# Patient Record
Sex: Female | Born: 1955 | Race: Black or African American | Hispanic: No | Marital: Married | Smoking: Never smoker
Health system: Southern US, Community
[De-identification: ages and names within clinical notes are randomized; demographics above are authoritative.]

## PROBLEM LIST (undated history)

## (undated) DIAGNOSIS — I10 Essential (primary) hypertension: Secondary | ICD-10-CM

## (undated) DIAGNOSIS — M1990 Unspecified osteoarthritis, unspecified site: Secondary | ICD-10-CM

## (undated) HISTORY — PX: ABDOMINAL HYSTERECTOMY: SHX81

---

## 2005-05-09 ENCOUNTER — Ambulatory Visit: Payer: Self-pay | Admitting: Internal Medicine

## 2005-05-16 ENCOUNTER — Ambulatory Visit: Payer: Self-pay | Admitting: Internal Medicine

## 2007-03-30 ENCOUNTER — Ambulatory Visit: Payer: Self-pay

## 2007-04-21 IMAGING — CT CT ABD-PELV W/ CM
1 of 2 series · 16 of 32 positions shown, 20 images · non-contrast
Comparison: none

REASON FOR EXAM: periumbilical pain radiating to RLQ and LLQ hematuria
COMMENTS:

PROCEDURE:     CT  - CT ABDOMEN / PELVIS  W  - May 09, 2005  [DATE]
RESULT:
HISTORY: Periumbilical pain.

[Series 2: abdomen · axial · 0.63mm/px · z∈[+556,+948]mm · 16 of 55 slices shown, 20 images]
[im 3/55  soft-tissue]
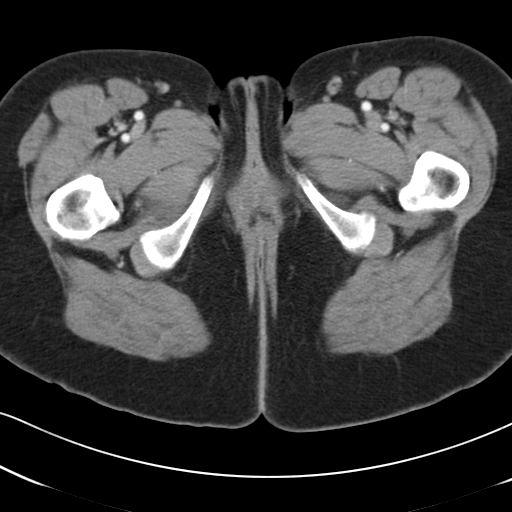
[im 3/55  bone]
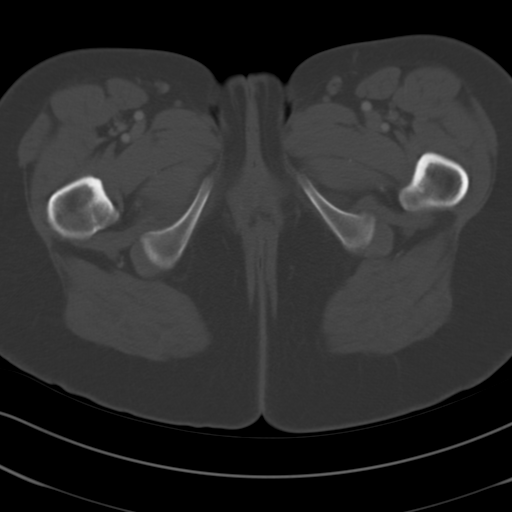
[im 7/55  soft-tissue]
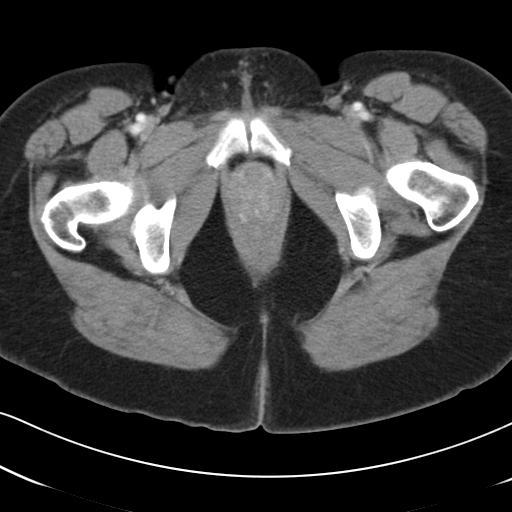
[im 11/55  soft-tissue]
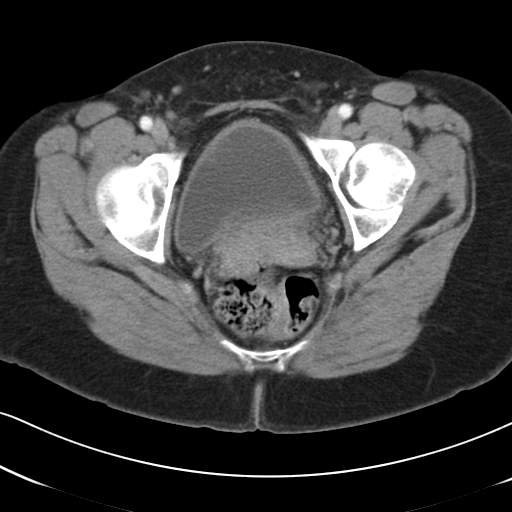
[im 16/55  soft-tissue]
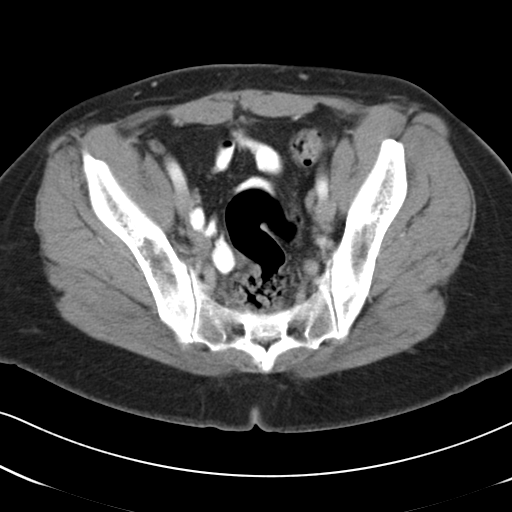
[im 18/55  soft-tissue]
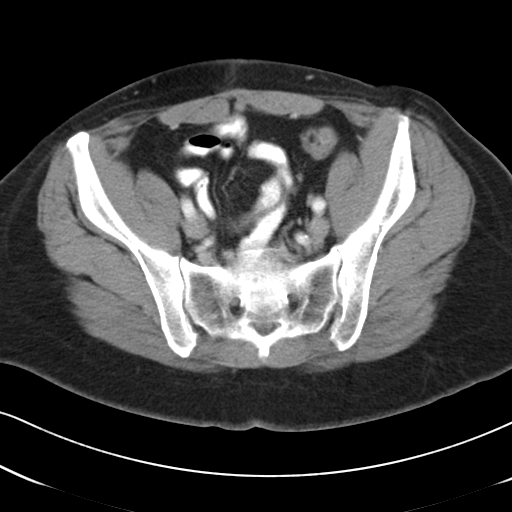
[im 22/55  soft-tissue]
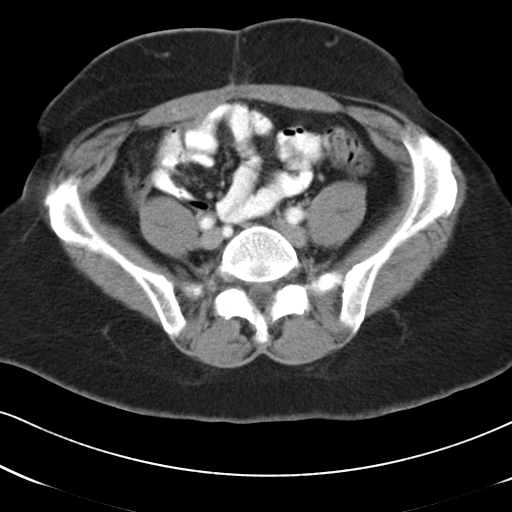
[im 26/55  soft-tissue]
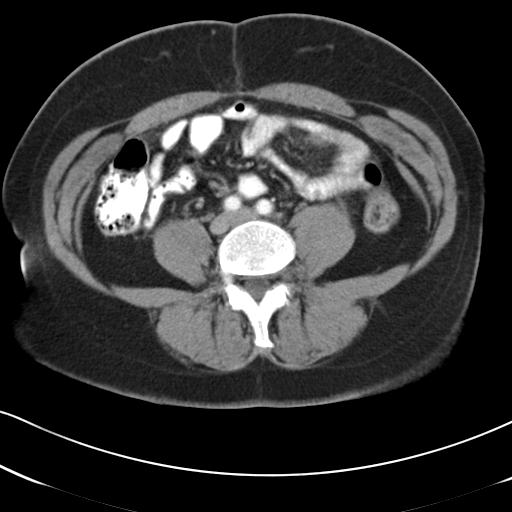
[im 29/55  soft-tissue]
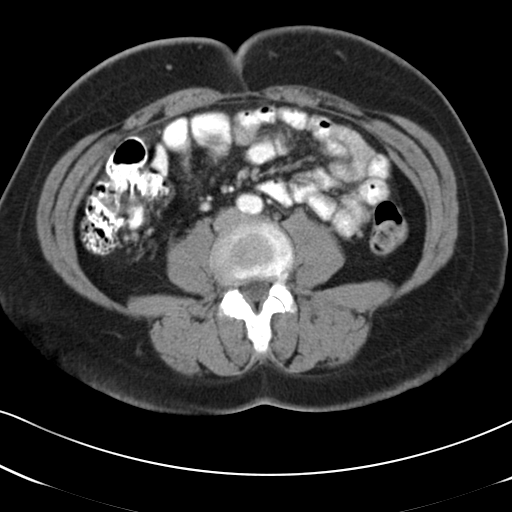
[im 33/55  soft-tissue]
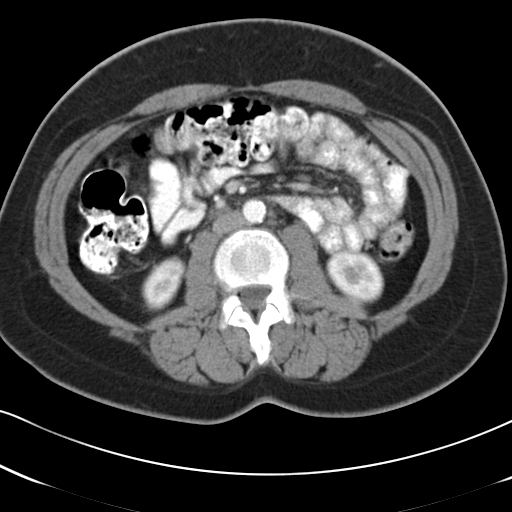
[im 33/55  bone]
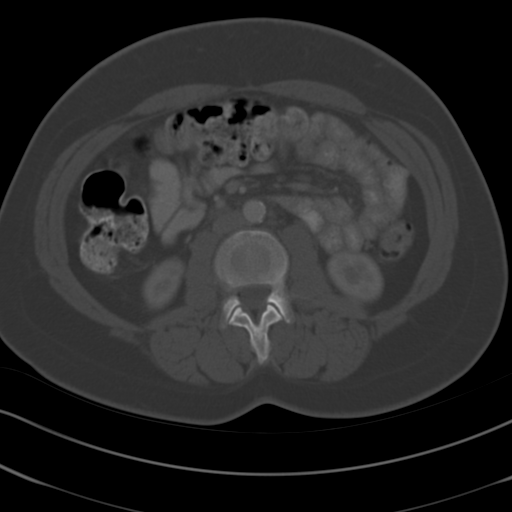
[im 37/55  soft-tissue]
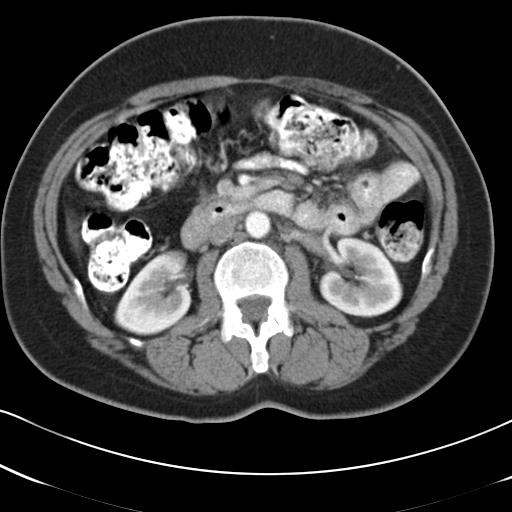
[im 42/55  soft-tissue]
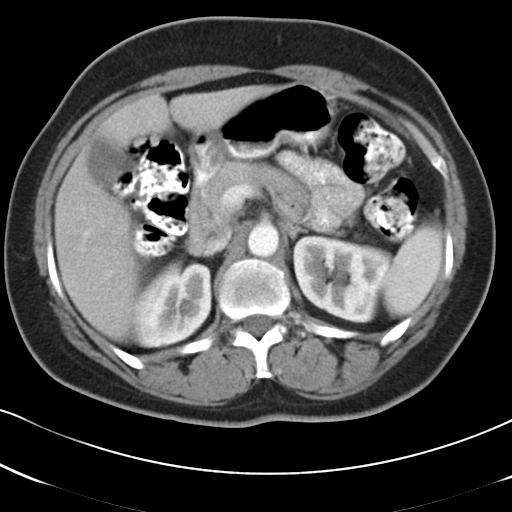
[im 44/55  soft-tissue]
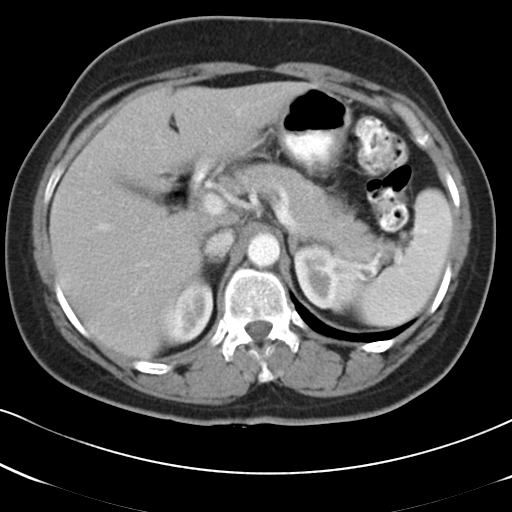
[im 46/55  lung]
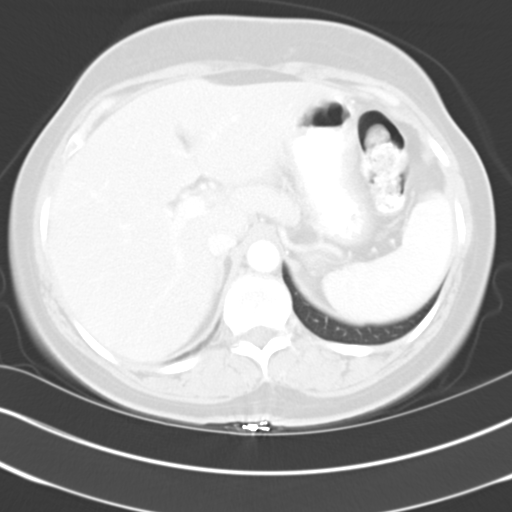
[im 48/55  soft-tissue]
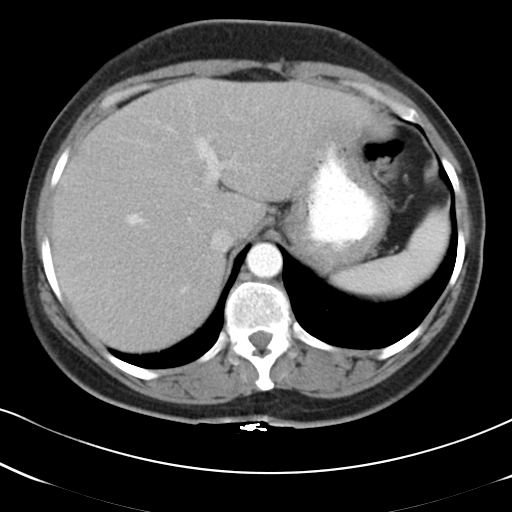
[im 48/55  lung]
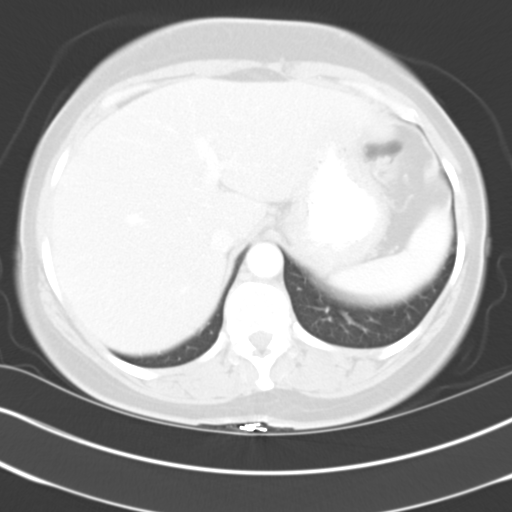
[im 50/55  lung]
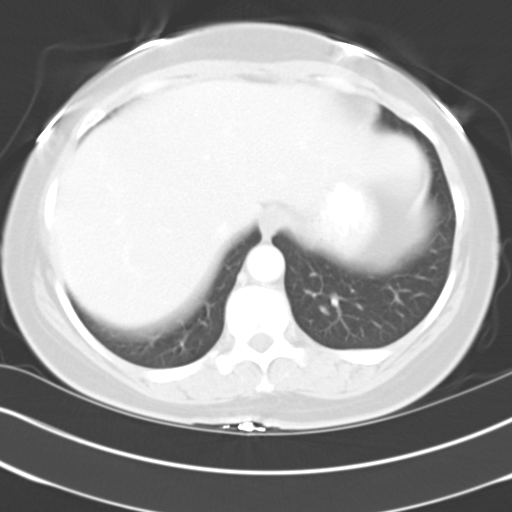
[im 52/55  soft-tissue]
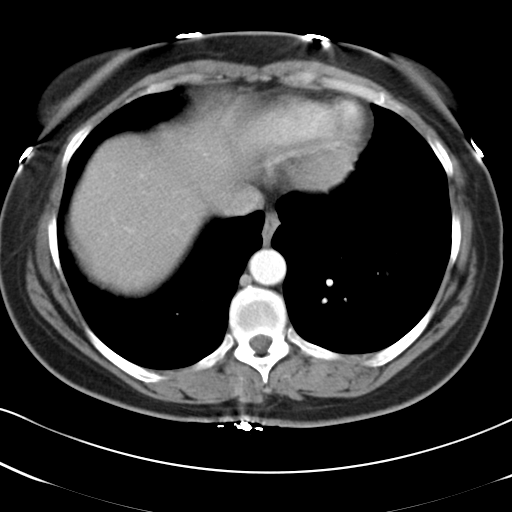
[im 52/55  lung]
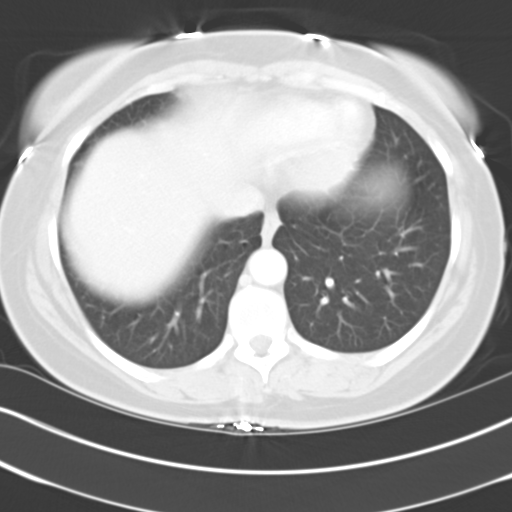

[16 of 32 positions shown; findings below may reference images not displayed]

COMPARISON STUDIES: None.

PROCEDURE AND FINDINGS: The liver and spleen are normal. The pancreas is
normal.  The gallbladder is non-distended.  The adrenals are normal. No
focal renal abnormalities are noted. There is no bowel distention. Bilateral
adnexal fullness is noted.  Pelvic ultrasound can be obtained if clinically
indicated.
IMPRESSION: 1)Mild bilateral adenexal fullness..

## 2008-07-13 ENCOUNTER — Ambulatory Visit: Payer: Self-pay | Admitting: Obstetrics and Gynecology

## 2009-09-05 ENCOUNTER — Ambulatory Visit: Payer: Self-pay | Admitting: Obstetrics and Gynecology

## 2011-03-18 ENCOUNTER — Ambulatory Visit: Payer: Self-pay | Admitting: Obstetrics and Gynecology

## 2012-03-25 ENCOUNTER — Ambulatory Visit: Payer: Self-pay | Admitting: Obstetrics and Gynecology

## 2013-02-15 LAB — CBC
HCT: 41.9 % (ref 35.0–47.0)
HGB: 14.4 g/dL (ref 12.0–16.0)
MCHC: 34.3 g/dL (ref 32.0–36.0)
MCV: 93 fL (ref 80–100)
Platelet: 227 10*3/uL (ref 150–440)
RDW: 12.7 % (ref 11.5–14.5)

## 2013-02-15 LAB — ETHANOL
Ethanol %: <0.003 % (ref 0.000–0.080)
Ethanol: <3 mg/dL

## 2013-02-15 LAB — COMPREHENSIVE METABOLIC PANEL
Albumin: 4.1 g/dL (ref 3.4–5.0)
Alkaline Phosphatase: 99 U/L (ref 50–136)
Anion Gap: 4 — ABNORMAL LOW (ref 7–16)
BUN: 15 mg/dL (ref 7–18)
Bilirubin,Total: 0.4 mg/dL (ref 0.2–1.0)
Co2: 30 mmol/L (ref 21–32)
Creatinine: 0.77 mg/dL (ref 0.60–1.30)
Glucose: 126 mg/dL — ABNORMAL HIGH (ref 65–99)
Osmolality: 282 (ref 275–301)
Potassium: 3.5 mmol/L (ref 3.5–5.1)
SGOT(AST): 22 U/L (ref 15–37)

## 2013-02-15 LAB — URINALYSIS, COMPLETE
Ketone: NEGATIVE
Leukocyte Esterase: NEGATIVE
Ph: 8 (ref 4.5–8.0)
WBC UR: 4 /HPF (ref 0–5)

## 2013-02-15 LAB — LIPASE, BLOOD: Lipase: 83 U/L (ref 73–393)

## 2013-02-16 ENCOUNTER — Inpatient Hospital Stay: Payer: Self-pay | Admitting: Surgery

## 2013-03-29 ENCOUNTER — Ambulatory Visit: Payer: Self-pay | Admitting: Obstetrics and Gynecology

## 2013-05-19 ENCOUNTER — Ambulatory Visit: Payer: Self-pay | Admitting: Unknown Physician Specialty

## 2014-04-25 ENCOUNTER — Ambulatory Visit: Payer: Self-pay | Admitting: Obstetrics and Gynecology

## 2014-07-28 NOTE — H&P (Signed)
Subjective/Chief Complaint N/V/abd pain   History of Present Illness acute onset N/V and abd pain approx 1630 with muklt emesis. Pain is improved now. Vomited contrast after CT. NG placed. No prior episode. No melena or hematochezia, no flatus since onset of symptoms.   Past History PMH none PSH hysterect, ovaries in place   Past Med/Surgical Hx:  Hysterectomy - Partial:   ALLERGIES:  No Known Allergies:   Family and Social History:  Family History Non-Contributory   Social History negative tobacco, negative ETOH   Place of Living Home   Review of Systems:  Fever/Chills No   Cough No   Abdominal Pain Yes  improved   Diarrhea No   Constipation No   Nausea/Vomiting Yes   SOB/DOE No   Chest Pain No   Dysuria No   Tolerating Diet Nauseated  Vomiting   Physical Exam:  GEN no acute distress, gagging from ng placement   HEENT pink conjunctivae   NECK supple   RESP normal resp effort  clear BS   CARD regular rate   ABD denies tenderness  soft  infraumb scar, min distended, nontender   LYMPH negative neck   EXTR positive cyanosis/clubbing   SKIN normal to palpation   PSYCH alert, A+O to time, place, person, good insight   Lab Results: Hepatic:  11-Nov-14 21:10   Bilirubin, Total 0.4  Alkaline Phosphatase 99  SGPT (ALT) 26  SGOT (AST) 22  Total Protein, Serum 7.8  Albumin, Serum 4.1  Routine Chem:  11-Nov-14 21:10   Glucose, Serum  126  BUN 15  Creatinine (comp) 0.77  Sodium, Serum 140  Potassium, Serum 3.5  Chloride, Serum 106  CO2, Serum 30  Calcium (Total), Serum 9.4  Osmolality (calc) 282  eGFR (African American) >60  eGFR (Non-African American) >60 (eGFR values <71mL/min/1.73 m2 may be an indication of chronic kidney disease (CKD). Calculated eGFR is useful in patients with stable renal function. The eGFR calculation will not be reliable in acutely ill patients when serum creatinine is changing rapidly. It is not useful in   patients on dialysis. The eGFR calculation may not be applicable to patients at the low and high extremes of body sizes, pregnant women, and vegetarians.)  Anion Gap  4  Lipase 83 (Result(s) reported on 15 Feb 2013 at 09:41PM.)    21:18   Ethanol, S. < 3  Ethanol % (comp) < 0.003 (Result(s) reported on 15 Feb 2013 at 11:49PM.)  Routine UA:  11-Nov-14 21:10   Color (UA) Yellow  Clarity (UA) Cloudy  Glucose (UA) Negative  Bilirubin (UA) Negative  Ketones (UA) Negative  Specific Gravity (UA) 1.010  Blood (UA) 1+  pH (UA) 8.0  Protein (UA) Negative  Nitrite (UA) Negative  Leukocyte Esterase (UA) Negative (Result(s) reported on 15 Feb 2013 at 11:35PM.)  RBC (UA) 4 /HPF  WBC (UA) 4 /HPF  Bacteria (UA) 2+  Epithelial Cells (UA) 1 /HPF  Mucous (UA) PRESENT (Result(s) reported on 15 Feb 2013 at 11:35PM.)  Routine Hem:  11-Nov-14 21:10   WBC (CBC)  21.7  RBC (CBC) 4.52  Hemoglobin (CBC) 14.4  Hematocrit (CBC) 41.9  Platelet Count (CBC) 227 (Result(s) reported on 15 Feb 2013 at 09:34PM.)  MCV 93  MCH 31.8  MCHC 34.3  RDW 12.7   Radiology Results: CT:    12-Nov-14 01:31, CT Abdomen and Pelvis With Contrast  CT Abdomen and Pelvis With Contrast  REASON FOR EXAM:    (1) diffuse n/v diffuse abdominal  tenerness,   leukocytosis,; (2) diffuse n/v diff  COMMENTS:       PROCEDURE: CT  - CT ABDOMEN / PELVIS  W  - Feb 16 2013  1:31AM     CLINICAL DATA:  Nausea and vomiting. Diffuse abdominal tenderness.  Leukocytosis.    EXAM:  CT ABDOMEN AND PELVIS WITH CONTRAST    TECHNIQUE:  Multidetector CT imaging of the abdomen and pelvis was performed  using the standard protocol following bolus administration of  intravenous contrast.    CONTRAST:  125 ccIsovue 370    COMPARISON:  05/09/2005.    FINDINGS:  Interval small amount of free peritoneal fluid. The stomach is  dilated and filled with ingested oral contrast. The proximal  duodenum is mildly dilated and filled with oral  contrast. The distal  duodenum and proximal jejunum are normal in caliber. There are  multiple moderately dilated mid and distal jejunal loops with normal  caliber distal ileum. No bowel wall thickening or pneumatosis is  seen.  Unremarkable liver, spleen, pancreas, gallbladder, adrenal glands  and urinary bladder. Small bilateral renal cysts. No enlarged lymph  nodes. Normal appearing appendix. Minimal dependent atelectasis at  both lung bases. Mild lumbar and lower thoracic spine degenerative  changes and scoliosis.     IMPRESSION:  1. Findings described above most compatible with a closed loop mid  small bowel obstruction.  2. Small amount of free peritoneal fluid.  3. Dilated stomach, filled with the ingested oral contrast.      Electronically Signed    By: Enrique Sack M.D.    On: 02/16/2013 01:47         Verified By: Gerald Stabs, M.D.,    Assessment/Admission Diagnosis SBO admit, hydrate, reexamine and repeat KUB. If no improvement may need exploration today.   Electronic Signatures: Florene Glen (MD)  (Signed [phone] 03:01)  Authored: CHIEF COMPLAINT and HISTORY, PAST MEDICAL/SURGIAL HISTORY, ALLERGIES, FAMILY AND SOCIAL HISTORY, REVIEW OF SYSTEMS, PHYSICAL EXAM, LABS, Radiology, ASSESSMENT AND PLAN   Last Updated: 12-Nov-14 03:01 by Florene Glen (MD)

## 2014-07-28 NOTE — H&P (Signed)
PATIENT NAME:  Julia Callahan, KOCHAN MR#:  [id number] DATE OF BIRTH:  1955/07/29  DATE OF ADMISSION:  02/15/2013  CHIEF COMPLAINT: Nausea, vomiting and abdominal pain.   HISTORY OF PRESENT ILLNESS: This is a 59 year old female patient with diffuse abdominal pain that started at approximately 4:00 this afternoon. She has never had an episode like this before. About an hour after the abdominal pain started, she started vomiting, vomited 5 times at home and has vomited her contrast after her CT scan tonight. She is nauseated. She has not passed gas this evening, had a normal bowel movement prior to the onset of symptoms. She denies melena, hematochezia. No hematemesis. A nasogastric tube has just been placed and she is gagging from that. She denies fevers or chills, and states that her pain is better than it was when it started.   PAST MEDICAL HISTORY: None.   PAST SURGICAL HISTORY: Hysterectomy. Ovaries are still in place.   MEDICATIONS: None.   ALLERGIES: NONE.   FAMILY HISTORY: Noncontributory.   SOCIAL HISTORY: The patient does not smoke or drink.   REVIEW OF SYSTEMS: A 10-system review was performed and negative with the exception of that mentioned in the history of present illness.   PHYSICAL EXAMINATION: GENERAL: A healthy female patient. She is gagging from her recently nasogastric tube and spitting up clear, nonbilious fluid. She is actually covered in contrast media that she has just vomited.  VITAL SIGNS: Temperature of 98.4, pulse 86, respirations 18, pain scale of 4, blood pressure 169/82, 98% room air sat.  HEENT: Shows a nasogastric tube in place. No scleral icterus.  NECK: No palpable neck nodes.  CHEST: Clear to auscultation.  CARDIAC: Regular rate and rhythm.  ABDOMEN: Shows a low infraumbilical midline scar without hernia. The abdomen is only minimally distended, minimally tympanitic, and essentially nontender. No peritoneal signs.  EXTREMITIES: Without edema. Calves are  nontender.  NEUROLOGIC: Grossly intact.  INTEGUMENT: No jaundice.   LABORATORY VALUES: Demonstrate a serum glucose of 126. Otherwise, electrolytes are within normal limits. White blood cell count is 21.7, hemoglobin and hematocrit of 14 and 42, and a platelet count 227,000.   Urinalysis shows 1+ blood but 2+ bacteria, with specific gravity of 1.010.   CT scan is personally reviewed, which demonstrates gas in the colon, dilated stomach full of contrast, dilated loops of bowel. It is being read as possible a closed loop obstruction.   ASSESSMENT AND PLAN: This is a patient with the acute onset of abdominal pain, which has improved, nausea, vomiting and a history of a hysterectomy. 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 Her CT scan has been personally reviewed. Her exam is consistent with a small bowel obstruction, but she is essentially nontender at this point. A nasogastric tube has just been placed for a very dilated stomach, and in the process of placing the nasogastric tube, she apparently vomited all of the contrast material seen on CT scan in a dilated stomach. The plan would be to admit her to the hospital and hydrate her, with serial exams, repeating her KUB and comparing it to the findings of the CT scan. I discussed with her and her daughter the rationale for this approach and that, if she does not improve rapidly, because of the findings on CT scan, she may require emergent surgery in the form of exploratory laparotomy and small bowel reduction. The rationale for this has been discussed, and they are in agreement with this plan. I will place orders for  hydration and repeating films.   ____________________________ Adah Salvageichard E. Excell Seltzerooper, MD rec:cg D: 02/16/2013 02:45:04 ET T: 02/16/2013 03:06:21 ET JOB#: [id number]  cc: Adah Salvageichard E. Excell Seltzerooper, MD, <Dictator> Lattie HawICHARD E COOPER MD ELECTRONICALLY SIGNED 02/16/2013 6:44

## 2014-07-28 NOTE — Discharge Summary (Signed)
PATIENT NAME:  Julia Callahan, Julia Callahan MR#:  [id number] DATE OF BIRTH:  January 07, 1956  DATE OF ADMISSION:  02/16/2013 DATE OF DISCHARGE:  02/17/2013  FINAL DIAGNOSIS: Abdominal pain, partial small bowel obstruction.   PROCEDURES:  1.  CT scan of the abdomen and pelvis. 2.  Nasogastric tube. 3.  Serial abdominal examinations.  HOSPITAL COURSE SUMMARY: The patient was admitted with what looked like a closed bowel obstruction, however, had prompt resolution of her symptoms, passage of flatus and resolution completely of her abdominal pain, while even she was still in the Emergency Room. Repeat examination by me later that same morning demonstrated an absolutely benign abdomen. Her nasogastric tube was able to be discontinued. Her diet was advanced from clear liquids to regular diet. She was able to tolerate this without any difficulty. On hospital day number 1 the patient was completely pain free, eating a regular diet with a soft abdomen and was deemed suitable for discharge. She can keep her p.r.n. follow up with us in the office.   ____________________________ Redge GainerMark A. Egbert GaribaldiBird, MD mab:sb D: 02/17/2013 10:06:24 ET T: 02/17/2013 10:18:54 ET JOB#: [id number]  cc: Loraine LericheMark A. Egbert GaribaldiBird, MD, <Dictator> Raynald KempMARK A Tsutomu Barfoot MD ELECTRONICALLY SIGNED 02/20/2013 16:37

## 2014-09-05 ENCOUNTER — Emergency Department: Payer: BC Managed Care – PPO

## 2014-09-05 ENCOUNTER — Encounter: Payer: Self-pay | Admitting: Emergency Medicine

## 2014-09-05 ENCOUNTER — Other Ambulatory Visit: Payer: Self-pay

## 2014-09-05 ENCOUNTER — Emergency Department
Admission: EM | Admit: 2014-09-05 | Discharge: 2014-09-05 | Disposition: A | Discharge status: Home / Self Care | Payer: BC Managed Care – PPO | Attending: Emergency Medicine | Admitting: Emergency Medicine

## 2014-09-05 DIAGNOSIS — Z79899 Other long term (current) drug therapy: Secondary | ICD-10-CM | POA: Diagnosis not present

## 2014-09-05 DIAGNOSIS — F419 Anxiety disorder, unspecified: Secondary | ICD-10-CM | POA: Diagnosis not present

## 2014-09-05 DIAGNOSIS — R0789 Other chest pain: Secondary | ICD-10-CM | POA: Diagnosis not present

## 2014-09-05 DIAGNOSIS — I10 Essential (primary) hypertension: Secondary | ICD-10-CM | POA: Diagnosis not present

## 2014-09-05 DIAGNOSIS — R079 Chest pain, unspecified: Secondary | ICD-10-CM | POA: Diagnosis present

## 2014-09-05 HISTORY — DX: Essential (primary) hypertension: I10

## 2014-09-05 LAB — CBC
HEMATOCRIT: 40.2 % (ref 35.0–47.0)
Hemoglobin: 13.6 g/dL (ref 12.0–16.0)
MCH: 31.5 pg (ref 26.0–34.0)
MCHC: 33.9 g/dL (ref 32.0–36.0)
MCV: 92.8 fL (ref 80.0–100.0)
PLATELETS: 220 10*3/uL (ref 150–440)
RBC: 4.33 MIL/uL (ref 3.80–5.20)
RDW: 13.2 % (ref 11.5–14.5)
WBC: 7.8 10*3/uL (ref 3.6–11.0)

## 2014-09-05 LAB — BASIC METABOLIC PANEL
Anion gap: 7 (ref 5–15)
BUN: 10 mg/dL (ref 6–20)
CO2: 29 mmol/L (ref 22–32)
Calcium: 9.1 mg/dL (ref 8.9–10.3)
Chloride: 106 mmol/L (ref 101–111)
Creatinine, Ser: 0.65 mg/dL (ref 0.44–1.00)
GFR calc Af Amer: >60 mL/min (ref >60)
Glucose, Bld: 103 mg/dL — ABNORMAL HIGH (ref 65–99)
POTASSIUM: 3.9 mmol/L (ref 3.5–5.1)
Sodium: 142 mmol/L (ref 135–145)

## 2014-09-05 LAB — TROPONIN I: Troponin I: <0.03 ng/mL (ref <0.031)

## 2014-09-05 LAB — BRAIN NATRIURETIC PEPTIDE: B NATRIURETIC PEPTIDE 5: 26 pg/mL (ref 0.0–100.0)

## 2014-09-05 MED ORDER — NAPROXEN 500 MG PO TABS: 500.0000 mg | ORAL_TABLET | Freq: Two times a day (BID) | ORAL | Status: DC

## 2014-09-05 NOTE — ED Notes (Signed)
Pt presents with chest pain and shortness of breath for three weeks, pain is sharp in her right side chest and is constant.

## 2014-09-05 NOTE — ED Provider Notes (Signed)
[facility] Emergency Department Provider Note  ____________________________________________  Time seen: On arrival  I have reviewed the triage vital signs and the nursing notes.   HISTORY  Chief Complaint Chest Pain     HPI Julia Callahan is a 59 y.o. female who presents with right-sided lower chest discomfort for 3 weeks. The pain has been constant and mild in nature, she discussed it is achy. No shortness of breath. No radiation. No injury to the area.     Past Medical History  Diagnosis Date  . Hypertension     There are no active problems to display for this patient.   History reviewed. No pertinent past surgical history.  Current Outpatient Rx  Name  Route  Sig  Dispense  Refill  . NIFEdipine (PROCARDIA-XL/ADALAT-CC/NIFEDICAL-XL) 30 MG 24 hr tablet   Oral   Take 30 mg by mouth daily.         . simvastatin (ZOCOR) 20 MG tablet   Oral   Take 20 mg by mouth daily.         . valACYclovir (VALTREX) 500 MG tablet   Oral   Take 500 mg by mouth daily as needed.         . Vitamin D, Cholecalciferol, 1000 UNITS TABS   Oral   Take 1 tablet by mouth daily.           Allergies Sulfa antibiotics  No family history on file.  Social History History  Substance Use Topics  . Smoking status: Never Smoker   . Smokeless tobacco: Not on file  . Alcohol Use: Yes    Review of Systems  Constitutional: Negative for fever. Eyes: Negative for visual changes. ENT: Negative for sore throat Cardiovascular: Positive for chest wall pain Respiratory: Negative for shortness of breath. Gastrointestinal: Negative for abdominal pain, vomiting and diarrhea. Genitourinary: Negative for dysuria. Musculoskeletal: Negative for back pain. Skin: Negative for rash. Neurological: Negative for headaches or focal weakness Psychiatric: Positive anxiety  10-point ROS otherwise negative.  ____________________________________________   PHYSICAL  EXAM:  VITAL SIGNS: ED Triage Vitals  Enc Vitals Group     BP 09/05/14 0950 141/69 mmHg     Pulse Rate 09/05/14 0950 69     Resp 09/05/14 0950 18     Temp 09/05/14 0950 98.2 F (36.8 C)     Temp Source 09/05/14 0950 Oral     SpO2 09/05/14 0950 96 %     Weight 09/05/14 0950 161 lb (73.029 kg)     Height 09/05/14 0950 5\' 6"  (1.676 m)     Head Cir --      Peak Flow --      Pain Score 09/05/14 1000 9     Pain Loc --      Pain Edu? --      Excl. in GC? --      Constitutional: Alert and oriented. Well appearing and in no distress. Eyes: Conjunctivae are normal. PERRL. ENT   Head: Normocephalic and atraumatic.   Nose: No rhinnorhea.   Mouth/Throat: Mucous membranes are moist. Cardiovascular: Normal rate, regular rhythm. Normal and symmetric distal pulses are present in all extremities. No murmurs, rubs, or gallops. Patient with mild chest wall tenderness to palpation right lateral inferior sternum. No rash or erythema. Normal right breast exam performed with Mr. nurse in the room with me Julia Fermo(Tina) Respiratory: Normal respiratory effort without tachypnea nor retractions. Breath sounds are clear and equal bilaterally.  Gastrointestinal: Soft and non-tender in all  quadrants. No distention. There is no CVA tenderness. Genitourinary: deferred Musculoskeletal: Nontender with normal range of motion in all extremities. No lower extremity tenderness nor edema. Neurologic:  Normal speech and language. No gross focal neurologic deficits are appreciated. Skin:  Skin is warm, dry and intact. No rash noted. Psychiatric: Mood and affect are normal. Patient exhibits appropriate insight and judgment.  ____________________________________________    LABS (pertinent positives/negatives)  Labs Reviewed  BASIC METABOLIC PANEL - Abnormal; Notable for the following:    Glucose, Bld 103 (*)    All other components within normal limits  CBC  BRAIN NATRIURETIC PEPTIDE  TROPONIN I     ____________________________________________   EKG  ED ECG REPORT I, Jene Every, the attending physician, personally viewed and interpreted this ECG.  Date: 09/05/2014 EKG Time: 9:50 AM Rate: 70 Rhythm: normal sinus rhythm QRS Axis: normal Intervals: normal ST/T Wave abnormalities: normal Conduction Disutrbances: none Narrative Interpretation: unremarkable   ____________________________________________    RADIOLOGY  Normal chest x-ray  ____________________________________________   PROCEDURES  Procedure(s) performed: none  Critical Care performed: none  ____________________________________________   INITIAL IMPRESSION / ASSESSMENT AND PLAN / ED COURSE  Pertinent labs & imaging results that were available during my care of the patient were reviewed by me and considered in my medical decision making (see chart for details).  Exam and history of present illness most consistent with chest wall discomfort. No tachycardia. Troponin normal. EKG unremarkable. We'll treat with NSAIDs and have patient follow-up with PCP. Strict return precautions given  ____________________________________________   FINAL CLINICAL IMPRESSION(S) / ED DIAGNOSES  Final diagnoses:  Chest wall pain     Jene Every, MD 09/05/14 1544

## 2014-09-05 NOTE — Discharge Instructions (Signed)

## 2015-02-09 ENCOUNTER — Other Ambulatory Visit: Payer: Self-pay | Admitting: Specialist

## 2015-02-09 DIAGNOSIS — M2242 Chondromalacia patellae, left knee: Secondary | ICD-10-CM

## 2015-02-17 ENCOUNTER — Ambulatory Visit (HOSPITAL_BASED_OUTPATIENT_CLINIC_OR_DEPARTMENT_OTHER)
Admission: RE | Admit: 2015-02-17 | Discharge: 2015-02-17 | Disposition: A | Discharge status: Home / Self Care | Payer: BC Managed Care – PPO | Source: Ambulatory Visit | Attending: Specialist | Admitting: Specialist

## 2015-02-17 DIAGNOSIS — M25461 Effusion, right knee: Secondary | ICD-10-CM | POA: Insufficient documentation

## 2015-02-17 DIAGNOSIS — M659 Synovitis and tenosynovitis, unspecified: Secondary | ICD-10-CM | POA: Diagnosis not present

## 2015-02-17 DIAGNOSIS — S83242A Other tear of medial meniscus, current injury, left knee, initial encounter: Secondary | ICD-10-CM | POA: Insufficient documentation

## 2015-02-17 DIAGNOSIS — M2242 Chondromalacia patellae, left knee: Secondary | ICD-10-CM | POA: Diagnosis present

## 2015-02-17 DIAGNOSIS — M7122 Synovial cyst of popliteal space [Baker], left knee: Secondary | ICD-10-CM | POA: Insufficient documentation

## 2015-02-17 DIAGNOSIS — R609 Edema, unspecified: Secondary | ICD-10-CM | POA: Diagnosis not present

## 2015-02-17 DIAGNOSIS — M179 Osteoarthritis of knee, unspecified: Secondary | ICD-10-CM | POA: Diagnosis not present

## 2015-02-23 ENCOUNTER — Ambulatory Visit: Payer: BC Managed Care – PPO

## 2015-03-20 ENCOUNTER — Other Ambulatory Visit: Payer: Self-pay | Admitting: Obstetrics and Gynecology

## 2015-03-20 DIAGNOSIS — Z1231 Encounter for screening mammogram for malignant neoplasm of breast: Secondary | ICD-10-CM

## 2015-03-28 ENCOUNTER — Other Ambulatory Visit: Payer: BC Managed Care – PPO

## 2015-03-28 ENCOUNTER — Encounter: Payer: Self-pay | Admitting: *Deleted

## 2015-03-28 NOTE — Patient Instructions (Signed)
  Your procedure is scheduled on: 04-04-15 Report to [facility] 2ND FLOOR To find out your arrival time please call [phone] between 1PM - 3PM on 04-03-15  Remember: Instructions that are not followed completely may result in serious medical risk, up to and including death, or upon the discretion of your surgeon and anesthesiologist your surgery may need to be rescheduled.    _X___ 1. Do not eat food or drink liquids after midnight. No gum chewing or hard candies.     _X___ 2. No Alcohol for 24 hours before or after surgery.   ____ 3. Bring all medications with you on the day of surgery if instructed.    ____ 4. Notify your doctor if there is any change in your medical condition     (cold, fever, infections).     Do not wear jewelry, make-up, hairpins, clips or nail polish.  Do not wear lotions, powders, or perfumes. You may wear deodorant.  Do not shave 48 hours prior to surgery. Men may shave face and neck.  Do not bring valuables to the hospital.    [facility] is not responsible for any belongings or valuables.               Contacts, dentures or bridgework may not be worn into surgery.  Leave your suitcase in the car. After surgery it may be brought to your room.  For patients admitted to the hospital, discharge time is determined by your treatment team.   Patients discharged the day of surgery will not be allowed to drive home.   Please read over the following fact sheets that you were given:     ____ Take these medicines the morning of surgery with A SIP OF WATER:    1. NONE  2.   3.   4.  5.  6.  ____ Fleet Enema (as directed)   ____ Use CHG Soap as directed  ____ Use inhalers on the day of surgery  ____ Stop metformin 2 days prior to surgery    ____ Take 1/2 of usual insulin dose the night before surgery and none on the morning of surgery.   ____ Stop Coumadin/Plavix/aspirin-N/A  ____ Stop Anti-inflammatories-STOP MELOXICAM AND  NAPROXEN NOW-NO NSAIDS OR ASA PRODUCTS-TYLENOL OK   ____ Stop supplements until after surgery.    ____ Bring C-Pap to the hospital.

## 2015-04-04 ENCOUNTER — Ambulatory Visit: Payer: BC Managed Care – PPO | Admitting: Anesthesiology

## 2015-04-04 ENCOUNTER — Encounter: Payer: Self-pay | Admitting: *Deleted

## 2015-04-04 ENCOUNTER — Ambulatory Visit
Admission: RE | Admit: 2015-04-04 | Discharge: 2015-04-04 | Disposition: A | Discharge status: Home / Self Care | Payer: BC Managed Care – PPO | Source: Ambulatory Visit | Attending: Specialist | Admitting: Specialist

## 2015-04-04 ENCOUNTER — Encounter
Admission: RE | Disposition: A | Discharge status: Home / Self Care | Payer: Self-pay | Source: Ambulatory Visit | Attending: Specialist

## 2015-04-04 DIAGNOSIS — Y939 Activity, unspecified: Secondary | ICD-10-CM | POA: Diagnosis not present

## 2015-04-04 DIAGNOSIS — M659 Synovitis and tenosynovitis, unspecified: Secondary | ICD-10-CM | POA: Diagnosis not present

## 2015-04-04 DIAGNOSIS — M94262 Chondromalacia, left knee: Secondary | ICD-10-CM | POA: Insufficient documentation

## 2015-04-04 DIAGNOSIS — S83232A Complex tear of medial meniscus, current injury, left knee, initial encounter: Secondary | ICD-10-CM | POA: Insufficient documentation

## 2015-04-04 DIAGNOSIS — X58XXXA Exposure to other specified factors, initial encounter: Secondary | ICD-10-CM | POA: Diagnosis not present

## 2015-04-04 HISTORY — PX: KNEE ARTHROSCOPY: SHX127

## 2015-04-04 HISTORY — DX: Unspecified osteoarthritis, unspecified site: M19.90

## 2015-04-04 SURGERY — ARTHROSCOPY, KNEE
Anesthesia: General | Site: Knee | Laterality: Left | Wound class: Clean

## 2015-04-04 MED ORDER — ONDANSETRON HCL 4 MG/2ML IJ SOLN
INTRAMUSCULAR | Status: DC | PRN
  Administered 2015-04-04: 4 mg via INTRAVENOUS

## 2015-04-04 MED ORDER — MELOXICAM 15 MG PO TABS: 15.0000 mg | ORAL_TABLET | Freq: Every day | ORAL | Status: AC

## 2015-04-04 MED ORDER — MELOXICAM 7.5 MG PO TABS
15.0000 mg | ORAL_TABLET | Freq: Once | ORAL | Status: AC
  Administered 2015-04-04: 15 mg via ORAL

## 2015-04-04 MED ORDER — FENTANYL CITRATE (PF) 100 MCG/2ML IJ SOLN: 25.0000 ug | INTRAMUSCULAR | Status: DC | PRN

## 2015-04-04 MED ORDER — CEFAZOLIN SODIUM-DEXTROSE 2-3 GM-% IV SOLR
INTRAVENOUS | Status: AC
  Filled 2015-04-04: qty 50

## 2015-04-04 MED ORDER — GABAPENTIN 400 MG PO CAPS
ORAL_CAPSULE | ORAL | Status: AC
  Filled 2015-04-04: qty 1

## 2015-04-04 MED ORDER — MORPHINE SULFATE (PF) 4 MG/ML IV SOLN
INTRAVENOUS | Status: DC | PRN
  Administered 2015-04-04: 4 mg

## 2015-04-04 MED ORDER — GABAPENTIN 400 MG PO CAPS: 400.0000 mg | ORAL_CAPSULE | Freq: Three times a day (TID) | ORAL | Status: AC

## 2015-04-04 MED ORDER — GABAPENTIN 400 MG PO CAPS
400.0000 mg | ORAL_CAPSULE | Freq: Once | ORAL | Status: AC
  Administered 2015-04-04: 400 mg via ORAL

## 2015-04-04 MED ORDER — BUPIVACAINE-EPINEPHRINE (PF) 0.5% -1:200000 IJ SOLN
INTRAMUSCULAR | Status: DC | PRN
  Administered 2015-04-04: 6 mL
  Administered 2015-04-04: 30 mL

## 2015-04-04 MED ORDER — FENTANYL CITRATE (PF) 100 MCG/2ML IJ SOLN
INTRAMUSCULAR | Status: DC | PRN
  Administered 2015-04-04 (×2): 50 ug via INTRAVENOUS

## 2015-04-04 MED ORDER — BUPIVACAINE-EPINEPHRINE (PF) 0.5% -1:200000 IJ SOLN
INTRAMUSCULAR | Status: AC
  Filled 2015-04-04: qty 30

## 2015-04-04 MED ORDER — PROPOFOL 10 MG/ML IV BOLUS
INTRAVENOUS | Status: DC | PRN
  Administered 2015-04-04: 150 mg via INTRAVENOUS

## 2015-04-04 MED ORDER — CEFAZOLIN SODIUM-DEXTROSE 2-3 GM-% IV SOLR: 2.0000 g | Freq: Once | INTRAVENOUS | Status: DC

## 2015-04-04 MED ORDER — PHENYLEPHRINE HCL 10 MG/ML IJ SOLN
INTRAMUSCULAR | Status: DC | PRN
  Administered 2015-04-04: 100 ug via INTRAVENOUS
  Administered 2015-04-04: 50 ug via INTRAVENOUS
  Administered 2015-04-04: 100 ug via INTRAVENOUS

## 2015-04-04 MED ORDER — MIDAZOLAM HCL 2 MG/2ML IJ SOLN
INTRAMUSCULAR | Status: DC | PRN
  Administered 2015-04-04: 2 mg via INTRAVENOUS
  Administered 2015-04-04: 50 mg via INTRAVENOUS

## 2015-04-04 MED ORDER — FAMOTIDINE 20 MG PO TABS
ORAL_TABLET | ORAL | Status: AC
  Filled 2015-04-04: qty 1

## 2015-04-04 MED ORDER — LIDOCAINE HCL (CARDIAC) 20 MG/ML IV SOLN
INTRAVENOUS | Status: DC | PRN
  Administered 2015-04-04: 40 mg via INTRAVENOUS

## 2015-04-04 MED ORDER — MORPHINE SULFATE (PF) 4 MG/ML IV SOLN
INTRAVENOUS | Status: AC
  Filled 2015-04-04: qty 1

## 2015-04-04 MED ORDER — DEXAMETHASONE SODIUM PHOSPHATE 10 MG/ML IJ SOLN
INTRAMUSCULAR | Status: DC | PRN
  Administered 2015-04-04: 10 mg via INTRAVENOUS

## 2015-04-04 MED ORDER — HYDROMORPHONE HCL 1 MG/ML IJ SOLN: 0.2500 mg | INTRAMUSCULAR | Status: DC | PRN

## 2015-04-04 MED ORDER — FAMOTIDINE 20 MG PO TABS
20.0000 mg | ORAL_TABLET | Freq: Once | ORAL | Status: AC
  Administered 2015-04-04: 20 mg via ORAL

## 2015-04-04 MED ORDER — ONDANSETRON HCL 4 MG/2ML IJ SOLN: 4.0000 mg | Freq: Once | INTRAMUSCULAR | Status: DC | PRN

## 2015-04-04 MED ORDER — LACTATED RINGERS IV SOLN
INTRAVENOUS | Status: DC
  Administered 2015-04-04 (×2): via INTRAVENOUS

## 2015-04-04 MED ORDER — MELOXICAM 7.5 MG PO TABS
ORAL_TABLET | ORAL | Status: AC
  Filled 2015-04-04: qty 2

## 2015-04-04 MED ORDER — HYDROCODONE-ACETAMINOPHEN 5-325 MG PO TABS: 1.0000 | ORAL_TABLET | Freq: Four times a day (QID) | ORAL | Status: AC | PRN

## 2015-04-04 SURGICAL SUPPLY — 23 items
BAG COUNTER SPONGE EZ (MISCELLANEOUS) IMPLANT
BLADE AGGRESSIVE PLUS 4.0 (BLADE) IMPLANT
BUR RADIUS 4.0X18.5 (BURR) ×3 IMPLANT
CHLORAPREP W/TINT 26ML (MISCELLANEOUS) ×3 IMPLANT
COUNTER SPONGE BAG EZ (MISCELLANEOUS)
CUTTER SLOTTED WHISKER 4.0 (BURR) ×3 IMPLANT
GAUZE SPONGE 4X4 12PLY STRL (GAUZE/BANDAGES/DRESSINGS) ×3 IMPLANT
GLOVE BIO SURGEON STRL SZ8 (GLOVE) ×3 IMPLANT
GOWN STRL REUS W/ TWL LRG LVL3 (GOWN DISPOSABLE) ×2 IMPLANT
GOWN STRL REUS W/TWL LRG LVL3 (GOWN DISPOSABLE) ×4
IV LACTATED RINGER IRRG 3000ML (IV SOLUTION) ×12
IV LR IRRIG 3000ML ARTHROMATIC (IV SOLUTION) ×6 IMPLANT
KIT RM TURNOVER STRD PROC AR (KITS) ×3 IMPLANT
MANIFOLD NEPTUNE II (INSTRUMENTS) ×3 IMPLANT
NDL SAFETY 18GX1.5 (NEEDLE) ×6 IMPLANT
PACK ARTHROSCOPY KNEE (MISCELLANEOUS) ×3 IMPLANT
SET TUBE SUCT SHAVER OUTFL 24K (TUBING) ×3 IMPLANT
SOL PREP PVP 2OZ (MISCELLANEOUS) ×3
SOLUTION PREP PVP 2OZ (MISCELLANEOUS) ×1 IMPLANT
SUT ETHILON 3 0 FSLX (SUTURE) ×3 IMPLANT
SYR 30ML LL (SYRINGE) ×6 IMPLANT
TUBING ARTHRO INFLOW-ONLY STRL (TUBING) ×3 IMPLANT
WAND HAND CNTRL MULTIVAC 50 (MISCELLANEOUS) ×3 IMPLANT

## 2015-04-04 NOTE — Addendum Note (Signed)
Addendum  created 04/04/15 1135 by Junious SilkMark Trew Sunde, CRNA   Modules edited: Anesthesia LDA, Lines/Drains/Airways Properties Editor   Lines/Drains/Airways Properties Editor:  Properties of line/drain/airway/wound Airway 7 mm have been modified.; Properties of line/drain/airway/wound Airway have been modified.

## 2015-04-04 NOTE — Op Note (Signed)
04/04/2015  9:05 AM  PATIENT:  Julia Callahan    PRE-OPERATIVE DIAGNOSIS:  COMPLEX TEAR MEDIAL MENISCUS CURRENT INJURY                                                       FULL THICKNESS CARTILAGE DEFECT MEDIAL FEMORAL CONDYLE                                                       SYNOVITIS  POST-OPERATIVE DIAGNOSIS:  Same  PROCEDURE:  ARTHROSCOPY KNEE, MEDIAL MENISECTOMY  SURGEON:  Jontavious Commons E, MD  COMPLICATIONS:   None  EBL:  Minimal  TOURNIQUET TIME:   None  ANESTHESIA:  Gen. LMA  PREOPERATIVE INDICATIONS:  Julia Callahan is a  59 y.o. female with a diagnosis of COMPLEX TEAR MEDIAL MENISCUS CURRENT INJURY who failed conservative measures and elected for surgical management.    The risks benefits and alternatives were discussed with the patient preoperatively including but not limited to the risks of infection, bleeding, nerve injury, cardiopulmonary complications, the need for revision surgery, among others, and the patient was willing to proceed.  OPERATIVE IMPLANTS: None  OPERATIVE FINDINGS: Tear of the posterior horn of the medial meniscus.  Grade 4 chondromalacia medial femoral condyle.  Extensive synovitis. Intact anterior and posterior cruciate ligaments.  Normal lateral compartment with meniscus intact.  OPERATIVE PROCEDURE: The patient was brought to the operating room and underwent satisfactory general LMA anesthesia in the supine position.  The leg was prepped and draped in a sterile fashion.  Arthroscopy was carried out through standard portals.  The above findings were encountered upon arthroscopy.  The medial meniscus was debrided using basket forceps and her motorized resector.  ArthroCare wand was used to smooth off the final edges.  The medial femoral condyle was debrided with a whisker blade.  Some full-thickness loose cartilage fragments were also removed.  Partial synovectomy was carried out for visualization.  The intercondylar notch and lateral compartment  were intact.   Once this was completed and stabilized the joint was thoroughly irrigated.  Instruments were removed and knee wounds closed with 3-0 nylon.  Sponge and needle counts were correct. A dry sterile dressing was applied.  Patient was awakened and taken to recovery in good condition.   Valinda HoarHoward E Dudley Cooley, MD

## 2015-04-04 NOTE — Anesthesia Preprocedure Evaluation (Signed)
Anesthesia Evaluation  Patient identified by MRN, date of birth, ID band Patient awake    Reviewed: Allergy & Precautions, H&P , NPO status , Patient's Chart, lab work & pertinent test results, reviewed documented beta blocker date and time   Airway Mallampati: II  TM Distance: >3 FB Neck ROM: full    Dental  (+) Teeth Intact   Pulmonary neg pulmonary ROS,    Pulmonary exam normal        Cardiovascular Exercise Tolerance: Good hypertension, negative cardio ROS Normal cardiovascular exam Rate:Normal     Neuro/Psych negative neurological ROS  negative psych ROS   GI/Hepatic negative GI ROS, Neg liver ROS,   Endo/Other  negative endocrine ROS  Renal/GU negative Renal ROS  negative genitourinary   Musculoskeletal   Abdominal   Peds  Hematology negative hematology ROS (+)   Anesthesia Other Findings   Reproductive/Obstetrics negative OB ROS                             Anesthesia Physical Anesthesia Plan  ASA: II  Anesthesia Plan: General LMA   Post-op Pain Management:    Induction:   Airway Management Planned:   Additional Equipment:   Intra-op Plan:   Post-operative Plan:   Informed Consent: I have reviewed the patients History and Physical, chart, labs and discussed the procedure including the risks, benefits and alternatives for the proposed anesthesia with the patient or authorized representative who has indicated his/her understanding and acceptance.     Plan Discussed with: CRNA  Anesthesia Plan Comments:         Anesthesia Quick Evaluation  

## 2015-04-04 NOTE — Anesthesia Procedure Notes (Signed)
Procedure Name: LMA Insertion Date/Time: 04/04/2015 7:40 AM Performed by: Junious SilkNOLES, Kourtland Coopman Pre-anesthesia Checklist: Patient identified, Patient being monitored, Timeout performed, Emergency Drugs available and Suction available Patient Re-evaluated:Patient Re-evaluated prior to inductionOxygen Delivery Method: Circle system utilized Preoxygenation: Pre-oxygenation with 100% oxygen Intubation Type: IV induction Ventilation: Mask ventilation without difficulty LMA: LMA inserted LMA Size: 3.5 Tube type: Oral Number of attempts: 1 Placement Confirmation: positive ETCO2 and breath sounds checked- equal and bilateral Tube secured with: Tape Dental Injury: Teeth and Oropharynx as per pre-operative assessment

## 2015-04-04 NOTE — Transfer of Care (Signed)
Immediate Anesthesia Transfer of Care Note  Patient: Julia LassoDonna K Callahan  Procedure(s) Performed: Procedure(s): ARTHROSCOPY KNEE, MEDIAL MENISECTOMY (Left)  Patient Location: PACU  Anesthesia Type:General  Level of Consciousness: sedated  Airway & Oxygen Therapy: Patient Spontanous Breathing and Patient connected to face mask oxygen  Post-op Assessment: Report given to RN and Post -op Vital signs reviewed and stable  Post vital signs: Reviewed and stable  Last Vitals:  Filed Vitals:   04/04/15 0616  BP: 144/80  Pulse: 85  Temp: 36.7 C  Resp: 16    Complications: No apparent anesthesia complications

## 2015-04-04 NOTE — Anesthesia Postprocedure Evaluation (Signed)
Anesthesia Post Note  Patient: Julia LassoDonna K Callahan  Procedure(s) Performed: Procedure(s) (LRB): ARTHROSCOPY KNEE, MEDIAL MENISECTOMY (Left)  Patient location during evaluation: PACU Anesthesia Type: General Level of consciousness: awake and alert Pain management: pain level controlled Vital Signs Assessment: post-procedure vital signs reviewed and stable Respiratory status: spontaneous breathing, nonlabored ventilation, respiratory function stable and patient connected to nasal cannula oxygen Cardiovascular status: blood pressure returned to baseline and stable Postop Assessment: no signs of nausea or vomiting Anesthetic complications: no    Last Vitals:  Filed Vitals:   04/04/15 0910 04/04/15 0923  BP:  137/84  Pulse: 94 79  Temp:    Resp: 18 19    Last Pain:  Filed Vitals:   04/04/15 0929  PainSc: Asleep                 Yevette EdwardsJames G Adams

## 2015-04-04 NOTE — H&P (Signed)
THE PATIENT WAS SEEN IN THE HOLDING AREA.  HISTORY, ALLERGIES, HOME MEDICATIONS AND OPERATIVE PROCEDURE WERE REVIEWED. RISKS AND BENEFITS OF SURGERY DISCUSSED WITH PATIENT AGAIN.  NO CHANGES FROM INITIAL HISTORY AND PHYSICAL NOTED.    

## 2015-04-27 ENCOUNTER — Ambulatory Visit
Admission: RE | Admit: 2015-04-27 | Discharge: 2015-04-27 | Disposition: A | Discharge status: Home / Self Care | Payer: BC Managed Care – PPO | Source: Ambulatory Visit | Attending: Obstetrics and Gynecology | Admitting: Obstetrics and Gynecology

## 2015-04-27 DIAGNOSIS — Z1231 Encounter for screening mammogram for malignant neoplasm of breast: Secondary | ICD-10-CM | POA: Insufficient documentation

## 2016-03-18 ENCOUNTER — Other Ambulatory Visit: Payer: Self-pay | Admitting: Obstetrics and Gynecology

## 2016-03-18 DIAGNOSIS — Z1231 Encounter for screening mammogram for malignant neoplasm of breast: Secondary | ICD-10-CM

## 2016-04-28 ENCOUNTER — Ambulatory Visit
Admission: RE | Admit: 2016-04-28 | Discharge: 2016-04-28 | Disposition: A | Discharge status: Home / Self Care | Payer: BC Managed Care – PPO | Source: Ambulatory Visit | Attending: Obstetrics and Gynecology | Admitting: Obstetrics and Gynecology

## 2016-04-28 DIAGNOSIS — Z1231 Encounter for screening mammogram for malignant neoplasm of breast: Secondary | ICD-10-CM | POA: Diagnosis not present

## 2017-04-22 ENCOUNTER — Other Ambulatory Visit: Payer: Self-pay | Admitting: Obstetrics and Gynecology

## 2017-04-22 DIAGNOSIS — Z1231 Encounter for screening mammogram for malignant neoplasm of breast: Secondary | ICD-10-CM

## 2017-04-29 ENCOUNTER — Ambulatory Visit
Admission: RE | Admit: 2017-04-29 | Discharge: 2017-04-29 | Disposition: A | Discharge status: Home / Self Care | Payer: BC Managed Care – PPO | Source: Ambulatory Visit | Attending: Obstetrics and Gynecology | Admitting: Obstetrics and Gynecology

## 2017-04-29 DIAGNOSIS — Z1231 Encounter for screening mammogram for malignant neoplasm of breast: Secondary | ICD-10-CM | POA: Insufficient documentation

## 2018-04-30 ENCOUNTER — Other Ambulatory Visit: Payer: Self-pay | Admitting: Obstetrics and Gynecology

## 2018-04-30 DIAGNOSIS — Z1231 Encounter for screening mammogram for malignant neoplasm of breast: Secondary | ICD-10-CM

## 2018-05-14 ENCOUNTER — Ambulatory Visit
Admission: RE | Admit: 2018-05-14 | Discharge: 2018-05-14 | Disposition: A | Discharge status: Home / Self Care | Payer: BC Managed Care – PPO | Source: Ambulatory Visit | Attending: Obstetrics and Gynecology | Admitting: Obstetrics and Gynecology

## 2018-05-14 DIAGNOSIS — Z1231 Encounter for screening mammogram for malignant neoplasm of breast: Secondary | ICD-10-CM

## 2018-06-30 ENCOUNTER — Other Ambulatory Visit: Payer: Self-pay | Admitting: Specialist

## 2018-06-30 DIAGNOSIS — M1712 Unilateral primary osteoarthritis, left knee: Secondary | ICD-10-CM

## 2018-07-23 ENCOUNTER — Ambulatory Visit: Payer: BC Managed Care – PPO

## 2018-08-20 ENCOUNTER — Ambulatory Visit: Admission: RE | Admit: 2018-08-20 | Payer: BC Managed Care – PPO | Source: Ambulatory Visit

## 2018-09-14 ENCOUNTER — Ambulatory Visit: Payer: BC Managed Care – PPO

## 2019-03-25 ENCOUNTER — Ambulatory Visit: Payer: BC Managed Care – PPO | Attending: Internal Medicine

## 2019-03-25 DIAGNOSIS — Z20822 Contact with and (suspected) exposure to covid-19: Secondary | ICD-10-CM

## 2019-03-26 LAB — NOVEL CORONAVIRUS, NAA: SARS-CoV-2, NAA: NOT DETECTED

## 2019-05-06 ENCOUNTER — Other Ambulatory Visit: Payer: Self-pay | Admitting: Obstetrics and Gynecology

## 2019-05-06 DIAGNOSIS — Z1231 Encounter for screening mammogram for malignant neoplasm of breast: Secondary | ICD-10-CM

## 2019-06-03 ENCOUNTER — Ambulatory Visit
Admission: RE | Admit: 2019-06-03 | Discharge: 2019-06-03 | Disposition: A | Discharge status: Home / Self Care | Payer: BC Managed Care – PPO | Source: Ambulatory Visit | Attending: Obstetrics and Gynecology | Admitting: Obstetrics and Gynecology

## 2019-06-03 DIAGNOSIS — Z1231 Encounter for screening mammogram for malignant neoplasm of breast: Secondary | ICD-10-CM | POA: Diagnosis present

## 2020-05-08 ENCOUNTER — Other Ambulatory Visit: Payer: Self-pay | Admitting: Obstetrics and Gynecology

## 2020-05-08 DIAGNOSIS — Z1231 Encounter for screening mammogram for malignant neoplasm of breast: Secondary | ICD-10-CM

## 2020-06-04 ENCOUNTER — Other Ambulatory Visit: Payer: Self-pay

## 2020-06-04 ENCOUNTER — Ambulatory Visit
Admission: RE | Admit: 2020-06-04 | Discharge: 2020-06-04 | Disposition: A | Discharge status: Home / Self Care | Payer: BC Managed Care – PPO | Source: Ambulatory Visit | Attending: Obstetrics and Gynecology | Admitting: Obstetrics and Gynecology

## 2020-06-04 DIAGNOSIS — Z1231 Encounter for screening mammogram for malignant neoplasm of breast: Secondary | ICD-10-CM | POA: Diagnosis present

## 2021-05-09 ENCOUNTER — Other Ambulatory Visit: Payer: Self-pay | Admitting: Obstetrics and Gynecology

## 2021-05-09 DIAGNOSIS — Z1231 Encounter for screening mammogram for malignant neoplasm of breast: Secondary | ICD-10-CM

## 2021-06-05 ENCOUNTER — Other Ambulatory Visit: Payer: Self-pay

## 2021-06-05 ENCOUNTER — Ambulatory Visit
Admission: RE | Admit: 2021-06-05 | Discharge: 2021-06-05 | Disposition: A | Discharge status: Home / Self Care | Payer: BC Managed Care – PPO | Source: Ambulatory Visit | Attending: Obstetrics and Gynecology | Admitting: Obstetrics and Gynecology

## 2021-06-05 DIAGNOSIS — Z1231 Encounter for screening mammogram for malignant neoplasm of breast: Secondary | ICD-10-CM | POA: Insufficient documentation

## 2022-05-14 ENCOUNTER — Other Ambulatory Visit: Payer: Self-pay | Admitting: Obstetrics and Gynecology

## 2022-05-14 DIAGNOSIS — Z1231 Encounter for screening mammogram for malignant neoplasm of breast: Secondary | ICD-10-CM

## 2022-06-09 ENCOUNTER — Ambulatory Visit
Admission: RE | Admit: 2022-06-09 | Discharge: 2022-06-09 | Disposition: A | Discharge status: Home / Self Care | Payer: Medicare PPO | Source: Ambulatory Visit | Attending: Obstetrics and Gynecology | Admitting: Obstetrics and Gynecology

## 2022-06-09 DIAGNOSIS — Z1231 Encounter for screening mammogram for malignant neoplasm of breast: Secondary | ICD-10-CM | POA: Diagnosis present

## 2022-08-31 ENCOUNTER — Ambulatory Visit
Admission: EM | Admit: 2022-08-31 | Discharge: 2022-08-31 | Disposition: A | Discharge status: Home / Self Care | Payer: Medicare PPO | Attending: Urgent Care | Admitting: Urgent Care

## 2022-08-31 DIAGNOSIS — J00 Acute nasopharyngitis [common cold]: Secondary | ICD-10-CM | POA: Diagnosis not present

## 2022-08-31 DIAGNOSIS — R051 Acute cough: Secondary | ICD-10-CM | POA: Diagnosis not present

## 2022-08-31 MED ORDER — PSEUDOEPH-BROMPHEN-DM 30-2-10 MG/5ML PO SYRP: 5.0000 mL | ORAL_SOLUTION | Freq: Four times a day (QID) | ORAL | 0 refills | Status: AC | PRN

## 2022-08-31 MED ORDER — BENZONATATE 100 MG PO CAPS: ORAL_CAPSULE | ORAL | 0 refills | Status: AC

## 2022-08-31 NOTE — Discharge Instructions (Addendum)
As we discussed, there is no indication for treatment with an antibiotic today.  The cause of your symptoms are not bacterial and so an antibiotic would not be effective.  Your symptoms are caused by either allergies or a viral upper respiratory infection.  This generally produces "postnasal drip" which results in cough.  I am recommending use of nasal decongestants.  I recommend using over-the-counter pseudoephedrine (Sudafed) because you declined my recommendation for nasal sprays which I think will be most effective to treat your symptoms.  Nasal sprays are applied directly to the nasal passageway and do not have the same risk of side effects as systemic medications taken orally.  I am also prescribing cough suppressants to help with your symptoms.  I have prescribed benzonatate, a capsule which can be used both daytime and nighttime.  In addition I have prescribed a cough syrup which contains pseudoephedrine.

## 2022-08-31 NOTE — ED Provider Notes (Signed)
[name]    CSN: [id number] Arrival date & time: 08/31/22  0841      History   Chief Complaint Chief Complaint  Patient presents with   Cough   Nasal Congestion    HPI LAURIEANN LYERLY is a 67 y.o. female.    Cough   Patient presents to urgent care with concern for cough and nasal congestion x 4 days.  She states that the cough is frequent and dry.  She reports clear nasal congestion.  Endorses history of allergic rhinitis treated chronically with Claritin.  Patient specifically requests prescription for "Z-Pak" which she says she knows she needs because "my husband is a medical doctor."  Patient clarifies that her husband is a Risk analyst.  Past Medical History:  Diagnosis Date   Arthritis    RIGHT ARM   Hypertension     There are no problems to display for this patient.   Past Surgical History:  Procedure Laterality Date   ABDOMINAL HYSTERECTOMY     CESAREAN SECTION     KNEE ARTHROSCOPY Left 04/04/2015   Procedure: ARTHROSCOPY KNEE, MEDIAL MENISECTOMY;  Surgeon: Deeann Saint, MD;  Location: [facility] ORS;  Service: Orthopedics;  Laterality: Left;    OB History   No obstetric history on file.      Home Medications    Prior to Admission medications   Medication Sig Start Date End Date Taking? Authorizing Provider  gabapentin (NEURONTIN) 400 MG capsule Take 1 capsule (400 mg total) by mouth 3 (three) times daily. 04/04/15   Deeann Saint, MD  HYDROcodone-acetaminophen (NORCO) 5-325 MG tablet Take 1-2 tablets by mouth every 6 (six) hours as needed. 04/04/15   Deeann Saint, MD  meloxicam (MOBIC) 15 MG tablet Take 15 mg by mouth as needed for pain.    [provider]  meloxicam (MOBIC) 15 MG tablet Take 1 tablet (15 mg total) by mouth daily. 04/04/15   Deeann Saint, MD  NIFEdipine (PROCARDIA-XL/ADALAT-CC/NIFEDICAL-XL) 30 MG 24 hr tablet Take 30 mg by mouth at bedtime.     [provider]  simvastatin (ZOCOR) 20 MG tablet Take 20  mg by mouth at bedtime.     [provider]  traMADol (ULTRAM) 50 MG tablet Take 50 mg by mouth every 6 (six) hours as needed.    [provider]  valACYclovir (VALTREX) 500 MG tablet Take 500 mg by mouth daily as needed.    [provider]  Vitamin D, Cholecalciferol, 1000 UNITS TABS Take 1 tablet by mouth daily.    [provider]    Family History Family History  Problem Relation Age of Onset   Breast cancer Neg Hx     Social History Social History   Tobacco Use   Smoking status: Never  Substance Use Topics   Alcohol use: Yes    Comment: WINE OCC   Drug use: No     Allergies   Sulfa antibiotics   Review of Systems Review of Systems  Respiratory:  Positive for cough.      Physical Exam Triage Vital Signs ED Triage Vitals  Enc Vitals Group     BP 08/31/22 0926 (!) 144/79     Pulse Rate 08/31/22 0926 70     Resp 08/31/22 0926 18     Temp 08/31/22 0926 98.5 F (36.9 C)     Temp Source 08/31/22 0926 Oral     SpO2 08/31/22 0926 96 %     Weight --  Height --      Head Circumference --      Peak Flow --      Pain Score 08/31/22 0929 0     Pain Loc --      Pain Edu? --      Excl. in GC? --    No data found.  Updated Vital Signs BP (!) 144/79 (BP Location: Left Arm)   Pulse 70   Temp 98.5 F (36.9 C) (Oral)   Resp 18   SpO2 96%   Visual Acuity Right Eye Distance:   Left Eye Distance:   Bilateral Distance:    Right Eye Near:   Left Eye Near:    Bilateral Near:     Physical Exam Vitals reviewed.  Constitutional:      Appearance: Normal appearance. She is not ill-appearing.  HENT:     Nose: Congestion present. No rhinorrhea.  Skin:    General: Skin is warm and dry.  Neurological:     General: No focal deficit present.     Mental Status: She is alert and oriented to person, place, and time.  Psychiatric:        Mood and Affect: Mood normal.        Behavior: Behavior normal.      UC Treatments /  Results  Labs (all labs ordered are listed, but only abnormal results are displayed) Labs Reviewed - No data to display  EKG   Radiology No results found.  Procedures Procedures (including critical care time)  Medications Ordered in UC Medications - No data to display  Initial Impression / Assessment and Plan / UC Course  I have reviewed the triage vital signs and the nursing notes.  Pertinent labs & imaging results that were available during my care of the patient were reviewed by me and considered in my medical decision making (see chart for details).   CAREE FAKES is a 67 y.o. female presenting with nasal congestion and cough. Patient is afebrile without recent antipyretics, satting well on room air. Overall is well appearing though non-toxic, well hydrated, without respiratory distress. Pulmonary exam is unremarkable.  Lungs CTAB without wheezing, rhonchi, rales. RRR.  There is nasal congestion.  No rhinorrhea noted in clinic however patient is holding a tissue in her hand.  Reviewed relevant chart history.   Educated patient on antibiotic stewardship, ineffectiveness of antibiotics on viral infections and allergic symptoms, and risk of developing multidrug resistant infections as a result of inappropriate use.  Informed the patient that I would not prescribe an antibiotic today since her symptoms were so recent in duration.  I did inform her that if her symptoms persisted, she could be evaluated again for development of a secondary bacterial infection which would then need treatment with an antibiotic.  Given her most concerning symptom today is cough, I prescribed a cough syrup as well as benzonatate to suppress the cough and recommended nasal decongestants to reduce postnasal drip which I suspect is the contributing cause of the cough.  She declined my recommendation for nasal sprays saying "I do not do nasal sprays well".  As a result I recommended pseudoephedrine (Sudafed) as an  alternative, warning her that oral medications cause more side effects.  Counseled patient on potential for adverse effects with medications prescribed/recommended today, ER and return-to-clinic precautions discussed, patient verbalized understanding and agreement with care plan.  Final Clinical Impressions(s) / UC Diagnoses   Final diagnoses:  None   Discharge Instructions   None  ED Prescriptions   None    PDMP not reviewed this encounter.   Charma Igo, [place] 08/31/22 1002

## 2022-08-31 NOTE — ED Triage Notes (Signed)
Pt c/o constant dry cough and congestion for 4 days.  Home interventions: Alka seltzer

## 2022-10-07 ENCOUNTER — Ambulatory Visit
Admission: EM | Admit: 2022-10-07 | Discharge: 2022-10-07 | Disposition: A | Discharge status: Home / Self Care | Payer: Medicare PPO | Attending: Urgent Care | Admitting: Urgent Care

## 2022-10-07 DIAGNOSIS — N3001 Acute cystitis with hematuria: Secondary | ICD-10-CM | POA: Insufficient documentation

## 2022-10-07 DIAGNOSIS — R300 Dysuria: Secondary | ICD-10-CM | POA: Insufficient documentation

## 2022-10-07 LAB — POCT URINALYSIS DIP (MANUAL ENTRY)
Glucose, UA: 100 mg/dL — AB
Nitrite, UA: POSITIVE — AB
Protein Ur, POC: 100 mg/dL — AB
Spec Grav, UA: 1.02 (ref 1.010–1.025)
Urobilinogen, UA: 2 E.U./dL — AB
pH, UA: 6.5 (ref 5.0–8.0)

## 2022-10-07 MED ORDER — CEPHALEXIN 500 MG PO CAPS: 500.0000 mg | ORAL_CAPSULE | Freq: Four times a day (QID) | ORAL | 0 refills | Status: AC

## 2022-10-07 NOTE — ED Triage Notes (Signed)
Patient to Urgent Care with complaints of urinary frequency/ dysuria that started four days ago. Denies any fevers. Denies any vaginal discharge.   Has been taking azo.

## 2022-10-07 NOTE — ED Provider Notes (Signed)
Julia Callahan    CSN: [id number] Arrival date & time: 10/07/22  0803      History   Chief Complaint Chief Complaint  Patient presents with   Urinary Frequency    Julia Callahan is a 67 y.o. female.    Urinary Frequency    Presents to UC with complaint of urinary frequency and dysuria starting 4 days ago.  Denies fever, denies vaginal discharge.  Denies abdominal pain.  Denies back pain.  Review of the patient's chart indicates no recent episodes of acute cystitis.  Past Medical History:  Diagnosis Date   Arthritis    RIGHT ARM   Hypertension     There are no problems to display for this patient.   Past Surgical History:  Procedure Laterality Date   ABDOMINAL HYSTERECTOMY     CESAREAN SECTION     KNEE ARTHROSCOPY Left 04/04/2015   Procedure: ARTHROSCOPY KNEE, MEDIAL MENISECTOMY;  Surgeon: Deeann Saint, MD;  Location: [facility] ORS;  Service: Orthopedics;  Laterality: Left;    OB History   No obstetric history on file.      Home Medications    Prior to Admission medications   Medication Sig Start Date End Date Taking? Authorizing Provider  benzonatate (TESSALON) 100 MG capsule Take 1-2 tablets 3 times a day as needed for cough Patient not taking: Reported on 10/07/2022 08/31/22   Yatzary Merriweather, Jeannett Senior, FNP  brompheniramine-pseudoephedrine-DM 30-2-10 MG/5ML syrup Take 5 mLs by mouth 4 (four) times daily as needed. Patient not taking: Reported on 10/07/2022 08/31/22   Eufelia Veno, Jeannett Senior, FNP  gabapentin (NEURONTIN) 400 MG capsule Take 1 capsule (400 mg total) by mouth 3 (three) times daily. Patient not taking: Reported on 10/07/2022 04/04/15   Deeann Saint, MD  HYDROcodone-acetaminophen [facility]) 5-325 MG tablet Take 1-2 tablets by mouth every 6 (six) hours as needed. Patient not taking: Reported on 10/07/2022 04/04/15   Deeann Saint, MD  lisinopril (ZESTRIL) 10 MG tablet Take 10 mg by mouth daily.    [provider]  meloxicam (MOBIC) 15 MG tablet  Take 15 mg by mouth as needed for pain. Patient not taking: Reported on 10/07/2022    [provider]  meloxicam (MOBIC) 15 MG tablet Take 1 tablet (15 mg total) by mouth daily. Patient not taking: Reported on 10/07/2022 04/04/15   Deeann Saint, MD  NIFEdipine (PROCARDIA-XL/ADALAT-CC/NIFEDICAL-XL) 30 MG 24 hr tablet Take 30 mg by mouth at bedtime.  Patient not taking: Reported on 10/07/2022    [provider]  simvastatin (ZOCOR) 20 MG tablet Take 20 mg by mouth at bedtime.     [provider]  traMADol (ULTRAM) 50 MG tablet Take 50 mg by mouth every 6 (six) hours as needed. Patient not taking: Reported on 10/07/2022    [provider]  valACYclovir (VALTREX) 500 MG tablet Take 500 mg by mouth daily as needed.    [provider]  Vitamin D, Cholecalciferol, 1000 UNITS TABS Take 1 tablet by mouth daily.    [provider]    Family History Family History  Problem Relation Age of Onset   Breast cancer Neg Hx     Social History Social History   Tobacco Use   Smoking status: Never  Substance Use Topics   Alcohol use: Yes    Comment: WINE OCC   Drug use: No     Allergies   Sulfa antibiotics   Review of Systems Review of Systems  Genitourinary:  Positive for frequency.  Physical Exam Triage Vital Signs ED Triage Vitals  Enc Vitals Group     BP 10/07/22 0814 114/72     Pulse Rate 10/07/22 0814 71     Resp 10/07/22 0814 18     Temp 10/07/22 0814 98.1 F (36.7 C)     Temp src --      SpO2 10/07/22 0814 98 %     Weight --      Height --      Head Circumference --      Peak Flow --      Pain Score 10/07/22 0810 3     Pain Loc --      Pain Edu? --      Excl. in GC? --    No data found.  Updated Vital Signs BP 114/72   Pulse 71   Temp 98.1 F (36.7 C)   Resp 18   SpO2 98%   Visual Acuity Right Eye Distance:   Left Eye Distance:   Bilateral Distance:    Right Eye Near:   Left Eye Near:    Bilateral  Near:     Physical Exam Vitals reviewed.  Constitutional:      Appearance: Normal appearance.  Abdominal:     General: Bowel sounds are normal.     Palpations: Abdomen is soft.     Tenderness: There is no abdominal tenderness. There is no right CVA tenderness or left CVA tenderness.  Skin:    General: Skin is warm and dry.  Neurological:     General: No focal deficit present.     Mental Status: She is alert and oriented to person, place, and time.  Psychiatric:        Mood and Affect: Mood normal.        Behavior: Behavior normal.      UC Treatments / Results  Labs (all labs ordered are listed, but only abnormal results are displayed) Labs Reviewed  POCT URINALYSIS DIP (MANUAL ENTRY)    EKG   Radiology No results found.  Procedures Procedures (including critical care time)  Medications Ordered in UC Medications - No data to display  Initial Impression / Assessment and Plan / UC Course  I have reviewed the triage vital signs and the nursing notes.  Pertinent labs & imaging results that were available during my care of the patient were reviewed by me and considered in my medical decision making (see chart for details).   Julia Callahan is a 67 y.o. female presenting with dysuria. Patient is afebrile without recent antipyretics, satting well on room air. Overall is well appearing though non-toxic, well hydrated, without respiratory distress. Abdomen is soft and non tender. Negative CVA tenderness.  Reviewed relevant chart history.   UA result is strongly indicative of urinary tract infection with 3+ leukocytes, positive nitrite, small blood.  Will treat patient for acute cystitis with hematuria using cephalexin.  Sending urine culture to verify susceptibility.  Counseled patient on potential for adverse effects with medications prescribed/recommended today, ER and return-to-clinic precautions discussed, patient verbalized understanding and agreement with care  plan.  Final Clinical Impressions(s) / UC Diagnoses   Final diagnoses:  Dysuria   Discharge Instructions   None    ED Prescriptions   None    PDMP not reviewed this encounter.   Charma Igo, [place] 10/07/22 [phone]

## 2022-10-07 NOTE — Discharge Instructions (Signed)
Follow up here or with your primary care provider if your symptoms are worsening or not improving.     

## 2022-10-09 LAB — URINE CULTURE: Culture: >=100000 — AB

## 2023-05-18 IMAGING — MG MM DIGITAL SCREENING BILAT W/ TOMO AND CAD
8 series · 8 of 24 positions shown · non-contrast
Comparison: Previous exam(s).

CLINICAL DATA: Screening.

EXAM:
DIGITAL SCREENING BILATERAL MAMMOGRAM WITH TOMOSYNTHESIS AND CAD
TECHNIQUE: Bilateral screening digital craniocaudal and mediolateral oblique
mammograms were obtained. Bilateral screening digital breast
tomosynthesis was performed. The images were evaluated with
computer-aided detection.

[R CC synth-2D]
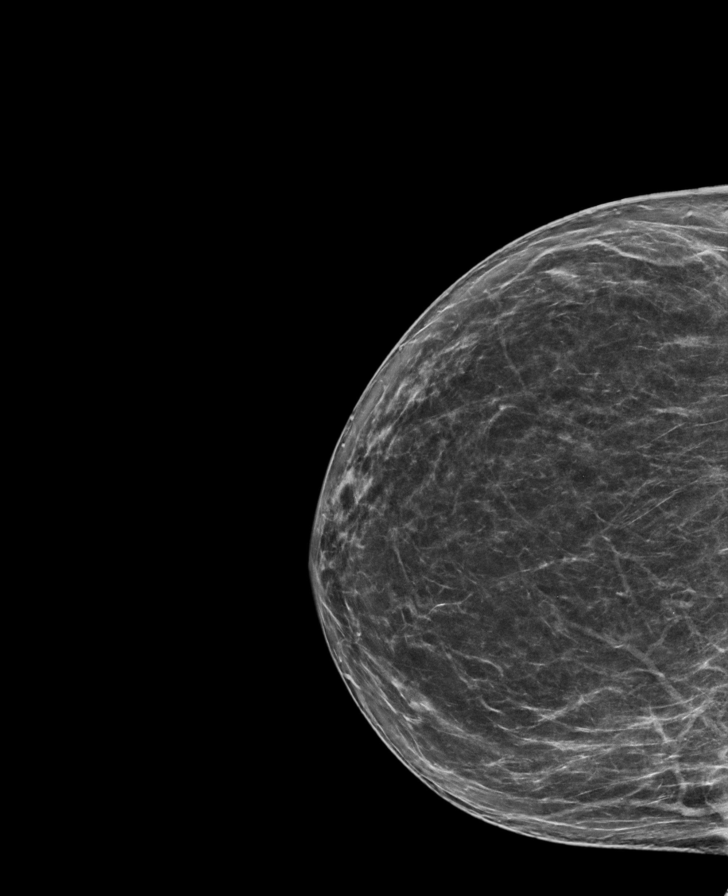

[L CC synth-2D]
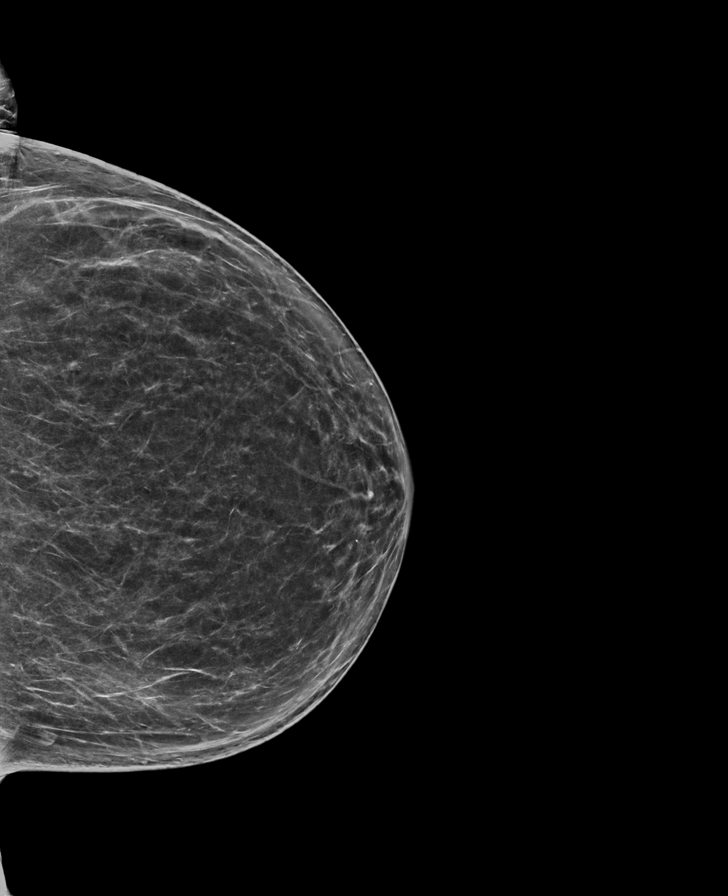

[L MLO synth-2D]
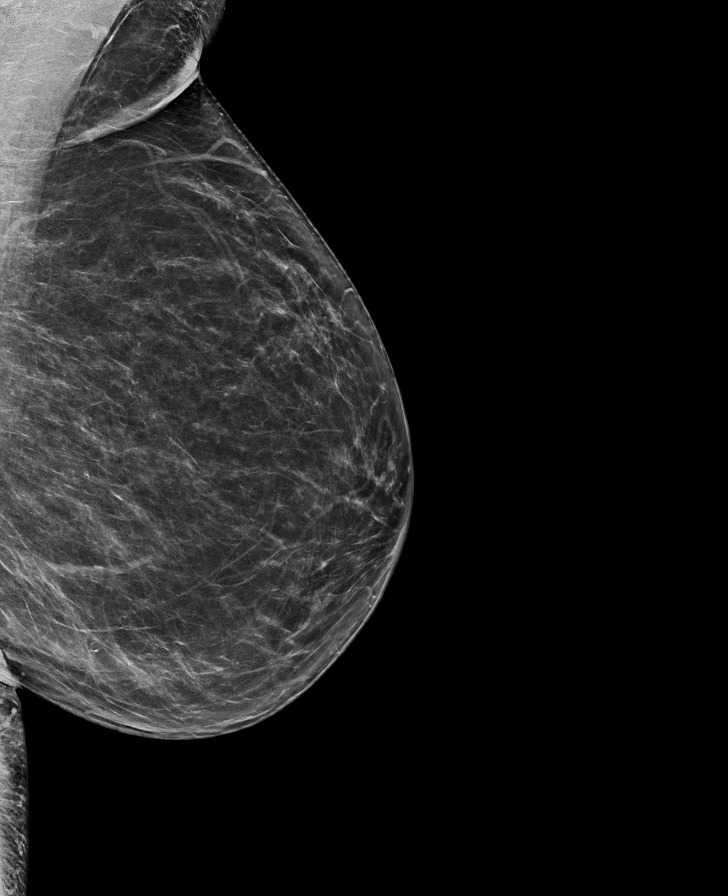

[R MLO synth-2D]
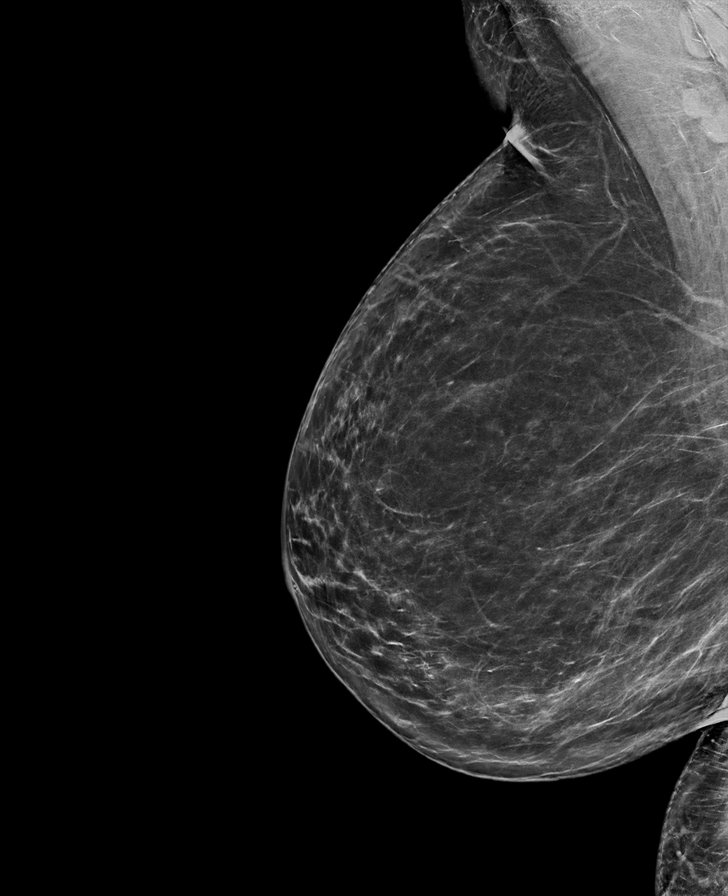

[L MLO tomo · tomo slice 39/77.0]
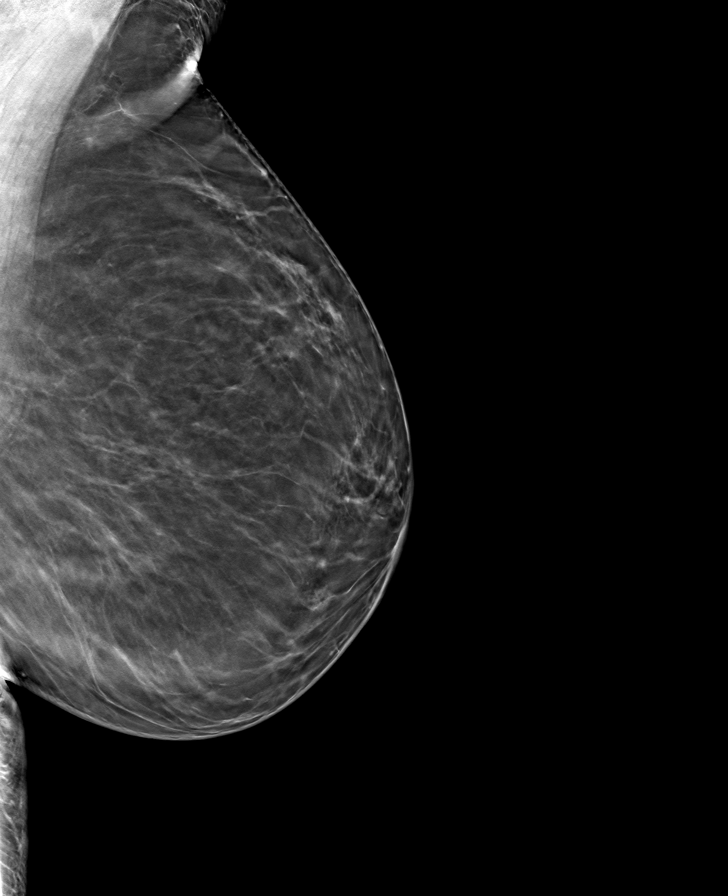

[R MLO tomo · tomo slice 40/79.0]
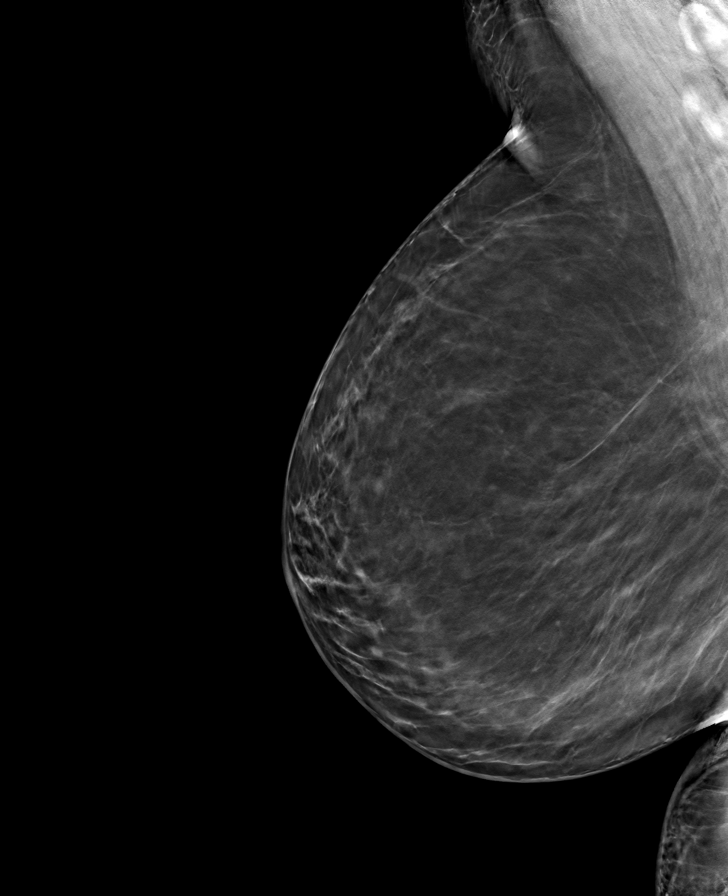

[R CC tomo · tomo slice 39/77.0]
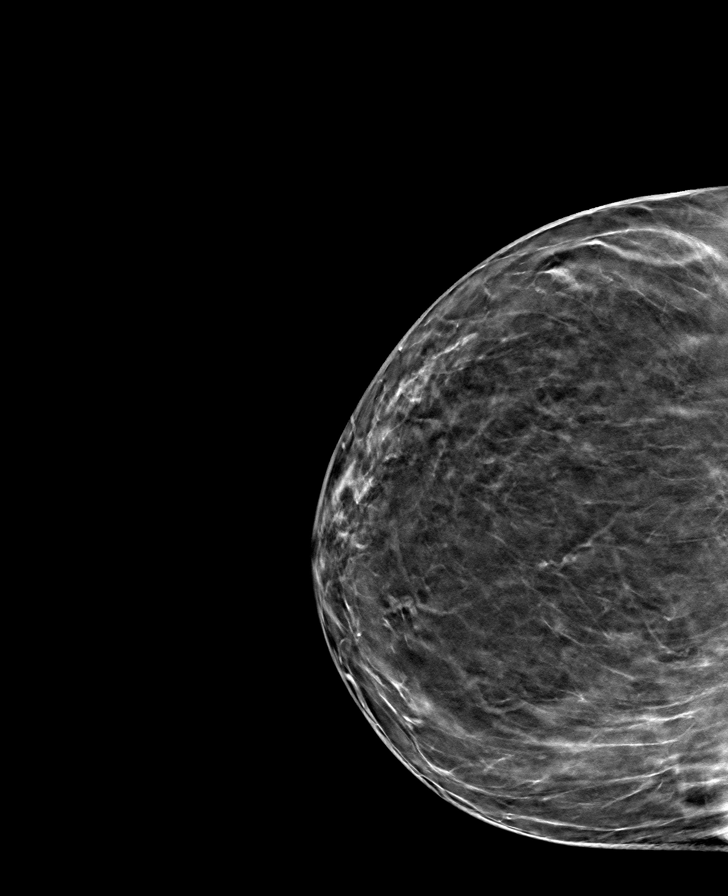

[L CC tomo · tomo slice 40/79.0]
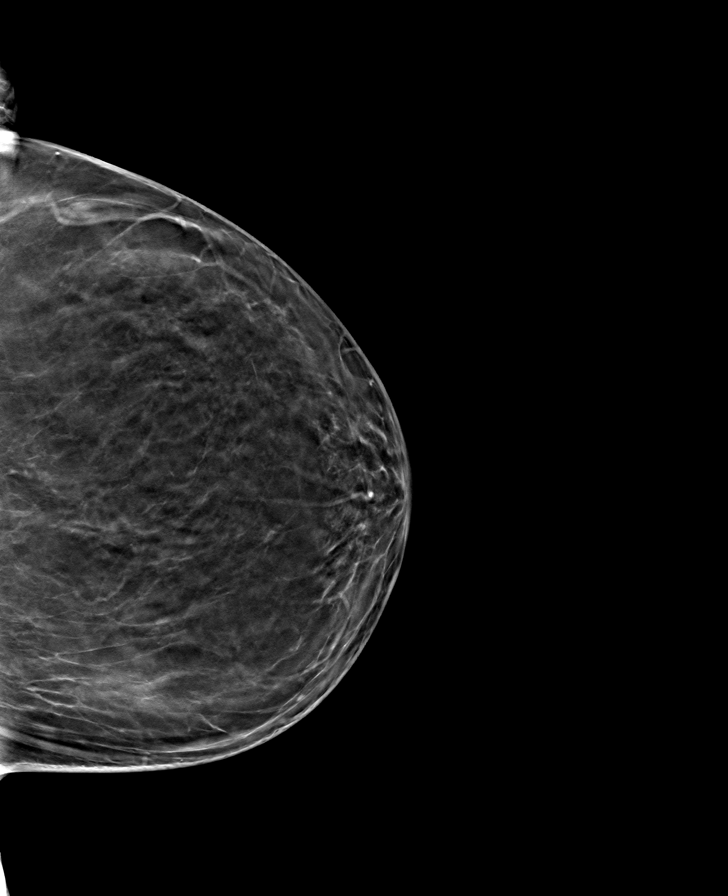

[8 of 24 positions shown; findings below may reference images not displayed]

ACR Breast Density Category b: There are scattered areas of
fibroglandular density.
FINDINGS: There are no findings suspicious for malignancy.
IMPRESSION: No mammographic evidence of malignancy. A result letter of this
screening mammogram will be mailed directly to the patient.

RECOMMENDATION:
Screening mammogram in one year. (Code:51-O-LD2)

BI-RADS CATEGORY  1: Negative.

## 2023-05-20 ENCOUNTER — Other Ambulatory Visit: Payer: Self-pay | Admitting: Obstetrics and Gynecology

## 2023-05-20 DIAGNOSIS — Z1231 Encounter for screening mammogram for malignant neoplasm of breast: Secondary | ICD-10-CM

## 2023-06-10 ENCOUNTER — Ambulatory Visit
Admission: RE | Admit: 2023-06-10 | Discharge: 2023-06-10 | Disposition: A | Discharge status: Home / Self Care | Payer: Medicare PPO | Source: Ambulatory Visit | Attending: Obstetrics and Gynecology | Admitting: Obstetrics and Gynecology

## 2023-06-10 DIAGNOSIS — Z1231 Encounter for screening mammogram for malignant neoplasm of breast: Secondary | ICD-10-CM | POA: Insufficient documentation
